# Patient Record
Sex: Male | Born: 1955
Health system: Southern US, Community
[De-identification: ages and names within clinical notes are randomized; demographics above are authoritative.]

## PROBLEM LIST (undated history)

## (undated) DIAGNOSIS — S2249XA Multiple fractures of ribs, unspecified side, initial encounter for closed fracture: Secondary | ICD-10-CM

## (undated) DIAGNOSIS — C801 Malignant (primary) neoplasm, unspecified: Secondary | ICD-10-CM

## (undated) DIAGNOSIS — G4730 Sleep apnea, unspecified: Secondary | ICD-10-CM

## (undated) DIAGNOSIS — E785 Hyperlipidemia, unspecified: Secondary | ICD-10-CM

## (undated) DIAGNOSIS — S2239XA Fracture of one rib, unspecified side, initial encounter for closed fracture: Secondary | ICD-10-CM

## (undated) HISTORY — DX: Sleep apnea, unspecified: G47.30

## (undated) HISTORY — DX: Hyperlipidemia, unspecified: E78.5

## (undated) HISTORY — DX: Malignant (primary) neoplasm, unspecified: C80.1

## (undated) HISTORY — PX: TONSILLECTOMY: SUR1361

## (undated) HISTORY — PX: VASECTOMY: SHX75

## (undated) HISTORY — PX: OTHER SURGICAL HISTORY: SHX169

## (undated) HISTORY — PX: POLYPECTOMY: SHX149

## (undated) HISTORY — PX: COLONOSCOPY: SHX174

---

## 2006-12-17 ENCOUNTER — Ambulatory Visit: Payer: Self-pay | Admitting: Internal Medicine

## 2006-12-17 LAB — CONVERTED CEMR LAB
ALT: 24 units/L (ref 0–40)
Basophils Relative: 0.7 % (ref 0.0–1.0)
Bilirubin Urine: NEGATIVE
Bilirubin, Direct: 0.2 mg/dL (ref 0.0–0.3)
CO2: 32 meq/L (ref 19–32)
Calcium: 9.2 mg/dL (ref 8.4–10.5)
Cholesterol: 260 mg/dL (ref 0–200)
Direct LDL: 193.6 mg/dL
Eosinophils Absolute: 0.3 10*3/uL (ref 0.0–0.6)
Eosinophils Relative: 5.7 % — ABNORMAL HIGH (ref 0.0–5.0)
GFR calc Af Amer: 102 mL/min
GFR calc non Af Amer: 84 mL/min
Glucose, Bld: 109 mg/dL — ABNORMAL HIGH (ref 70–99)
HDL: 45.8 mg/dL (ref >39.0)
Hemoglobin: 14.7 g/dL (ref 13.0–17.0)
Leukocytes, UA: NEGATIVE
Lymphocytes Relative: 18.6 % (ref 12.0–46.0)
MCV: 86 fL (ref 78.0–100.0)
Monocytes Absolute: 0.5 10*3/uL (ref 0.2–0.7)
Monocytes Relative: 8.8 % (ref 3.0–11.0)
Neutro Abs: 3.8 10*3/uL (ref 1.4–7.7)
PSA: 0.69 ng/mL (ref 0.10–4.00)
Platelets: 245 10*3/uL (ref 150–400)
Potassium: 4.7 meq/L (ref 3.5–5.1)
TSH: 0.82 microintl units/mL (ref 0.35–5.50)
Total Protein: 7 g/dL (ref 6.0–8.3)
Triglycerides: 147 mg/dL (ref 0–149)
Urine Glucose: NEGATIVE mg/dL
WBC: 5.6 10*3/uL (ref 4.5–10.5)

## 2007-08-19 ENCOUNTER — Ambulatory Visit: Payer: Self-pay | Admitting: Gastroenterology

## 2007-08-30 ENCOUNTER — Encounter: Payer: Self-pay | Admitting: Internal Medicine

## 2007-08-30 ENCOUNTER — Ambulatory Visit: Payer: Self-pay | Admitting: Gastroenterology

## 2008-02-20 ENCOUNTER — Encounter: Payer: Self-pay | Admitting: Internal Medicine

## 2008-02-20 DIAGNOSIS — J45909 Unspecified asthma, uncomplicated: Secondary | ICD-10-CM | POA: Insufficient documentation

## 2008-02-20 DIAGNOSIS — J309 Allergic rhinitis, unspecified: Secondary | ICD-10-CM | POA: Insufficient documentation

## 2008-02-20 DIAGNOSIS — E785 Hyperlipidemia, unspecified: Secondary | ICD-10-CM | POA: Insufficient documentation

## 2008-05-19 ENCOUNTER — Ambulatory Visit: Payer: Self-pay | Admitting: Internal Medicine

## 2008-05-19 LAB — CONVERTED CEMR LAB
AST: 17 units/L (ref 0–37)
Albumin: 4.2 g/dL (ref 3.5–5.2)
Alkaline Phosphatase: 41 units/L (ref 39–117)
BUN: 10 mg/dL (ref 6–23)
Basophils Relative: 0.2 % (ref 0.0–3.0)
Cholesterol: 191 mg/dL (ref 0–200)
Creatinine, Ser: 0.9 mg/dL (ref 0.4–1.5)
Eosinophils Absolute: 0.3 10*3/uL (ref 0.0–0.7)
Eosinophils Relative: 5.3 % — ABNORMAL HIGH (ref 0.0–5.0)
GFR calc Af Amer: 115 mL/min
Glucose, Bld: 102 mg/dL — ABNORMAL HIGH (ref 70–99)
HCT: 43.9 % (ref 39.0–52.0)
Hemoglobin, Urine: NEGATIVE
Hemoglobin: 15.3 g/dL (ref 13.0–17.0)
MCV: 89.4 fL (ref 78.0–100.0)
Monocytes Absolute: 0.5 10*3/uL (ref 0.1–1.0)
Monocytes Relative: 7.8 % (ref 3.0–12.0)
Nitrite: NEGATIVE
PSA: 0.5 ng/mL (ref 0.10–4.00)
Platelets: 224 10*3/uL (ref 150–400)
Potassium: 4.8 meq/L (ref 3.5–5.1)
RBC: 4.91 M/uL (ref 4.22–5.81)
Specific Gravity, Urine: 1.01 (ref 1.000–1.03)
Total Protein, Urine: NEGATIVE mg/dL
Total Protein: 6.8 g/dL (ref 6.0–8.3)
Urine Glucose: NEGATIVE mg/dL
WBC: 6 10*3/uL (ref 4.5–10.5)
pH: 8 (ref 5.0–8.0)

## 2008-05-28 ENCOUNTER — Ambulatory Visit: Payer: Self-pay | Admitting: Internal Medicine

## 2008-05-28 DIAGNOSIS — G479 Sleep disorder, unspecified: Secondary | ICD-10-CM | POA: Insufficient documentation

## 2008-05-28 DIAGNOSIS — M545 Low back pain, unspecified: Secondary | ICD-10-CM | POA: Insufficient documentation

## 2008-06-10 ENCOUNTER — Ambulatory Visit: Payer: Self-pay | Admitting: Internal Medicine

## 2008-06-10 DIAGNOSIS — D485 Neoplasm of uncertain behavior of skin: Secondary | ICD-10-CM | POA: Insufficient documentation

## 2008-06-26 ENCOUNTER — Telehealth: Payer: Self-pay | Admitting: Internal Medicine

## 2010-06-30 ENCOUNTER — Telehealth (INDEPENDENT_AMBULATORY_CARE_PROVIDER_SITE_OTHER): Payer: Self-pay | Admitting: *Deleted

## 2010-11-08 NOTE — Progress Notes (Signed)
  Phone Note Other Incoming   Request: Send information Summary of Call: Request for records received from [place] Sherriff's office. Request forwarded to [facility].     

## 2010-11-25 ENCOUNTER — Encounter: Payer: Self-pay | Admitting: Internal Medicine

## 2011-01-03 ENCOUNTER — Other Ambulatory Visit: Payer: Self-pay | Admitting: Internal Medicine

## 2011-01-06 ENCOUNTER — Telehealth: Payer: Self-pay | Admitting: *Deleted

## 2011-01-06 MED ORDER — ATORVASTATIN CALCIUM 10 MG PO TABS: 10.0000 mg | ORAL_TABLET | Freq: Every day | ORAL | Status: DC

## 2011-01-06 NOTE — Telephone Encounter (Signed)
refill 

## 2011-01-17 ENCOUNTER — Other Ambulatory Visit: Payer: Self-pay

## 2011-01-17 ENCOUNTER — Other Ambulatory Visit: Payer: Self-pay | Admitting: Internal Medicine

## 2011-01-17 DIAGNOSIS — Z0389 Encounter for observation for other suspected diseases and conditions ruled out: Secondary | ICD-10-CM

## 2011-01-17 DIAGNOSIS — Z0000 Encounter for general adult medical examination without abnormal findings: Secondary | ICD-10-CM

## 2011-01-19 ENCOUNTER — Other Ambulatory Visit (INDEPENDENT_AMBULATORY_CARE_PROVIDER_SITE_OTHER): Payer: BLUE CROSS/BLUE SHIELD

## 2011-01-19 ENCOUNTER — Other Ambulatory Visit (INDEPENDENT_AMBULATORY_CARE_PROVIDER_SITE_OTHER): Payer: BLUE CROSS/BLUE SHIELD | Admitting: Internal Medicine

## 2011-01-19 DIAGNOSIS — Z0389 Encounter for observation for other suspected diseases and conditions ruled out: Secondary | ICD-10-CM

## 2011-01-19 DIAGNOSIS — E785 Hyperlipidemia, unspecified: Secondary | ICD-10-CM

## 2011-01-19 DIAGNOSIS — Z0000 Encounter for general adult medical examination without abnormal findings: Secondary | ICD-10-CM

## 2011-01-19 LAB — BASIC METABOLIC PANEL
Calcium: 9.2 mg/dL (ref 8.4–10.5)
GFR: 99.97 mL/min (ref >60.00)
Glucose, Bld: 91 mg/dL (ref 70–99)
Sodium: 138 mEq/L (ref 135–145)

## 2011-01-19 LAB — LIPID PANEL
Total CHOL/HDL Ratio: 5
VLDL: 46 mg/dL — ABNORMAL HIGH (ref 0.0–40.0)

## 2011-01-19 LAB — URINALYSIS
Bilirubin Urine: NEGATIVE
Hgb urine dipstick: NEGATIVE
Leukocytes, UA: NEGATIVE
Nitrite: NEGATIVE

## 2011-01-19 LAB — HEPATIC FUNCTION PANEL
AST: 20 U/L (ref 0–37)
Alkaline Phosphatase: 43 U/L (ref 39–117)
Total Bilirubin: 0.9 mg/dL (ref 0.3–1.2)

## 2011-01-19 LAB — CBC WITH DIFFERENTIAL/PLATELET
Basophils Absolute: 0 10*3/uL (ref 0.0–0.1)
Hemoglobin: 15.5 g/dL (ref 13.0–17.0)
Lymphocytes Relative: 21.7 % (ref 12.0–46.0)
Monocytes Relative: 10.3 % (ref 3.0–12.0)
Platelets: 220 10*3/uL (ref 150.0–400.0)
RDW: 13.3 % (ref 11.5–14.6)
WBC: 6 10*3/uL (ref 4.5–10.5)

## 2011-01-19 LAB — TSH: TSH: 1 u[IU]/mL (ref 0.35–5.50)

## 2011-01-19 LAB — PSA: PSA: 0.78 ng/mL (ref 0.10–4.00)

## 2011-01-20 ENCOUNTER — Telehealth: Payer: Self-pay | Admitting: *Deleted

## 2011-01-24 ENCOUNTER — Ambulatory Visit (INDEPENDENT_AMBULATORY_CARE_PROVIDER_SITE_OTHER): Payer: BLUE CROSS/BLUE SHIELD | Admitting: Internal Medicine

## 2011-01-24 DIAGNOSIS — J309 Allergic rhinitis, unspecified: Secondary | ICD-10-CM

## 2011-01-24 DIAGNOSIS — G479 Sleep disorder, unspecified: Secondary | ICD-10-CM

## 2011-01-24 DIAGNOSIS — Z0000 Encounter for general adult medical examination without abnormal findings: Secondary | ICD-10-CM

## 2011-01-24 DIAGNOSIS — E785 Hyperlipidemia, unspecified: Secondary | ICD-10-CM

## 2011-01-24 MED ORDER — ATORVASTATIN CALCIUM 20 MG PO TABS: 20.0000 mg | ORAL_TABLET | Freq: Every day | ORAL | Status: DC

## 2011-01-24 MED ORDER — LORAZEPAM 0.5 MG PO TABS: 0.5000 mg | ORAL_TABLET | Freq: Two times a day (BID) | ORAL | Status: AC

## 2011-01-24 MED ORDER — ATORVASTATIN CALCIUM 10 MG PO TABS: 20.0000 mg | ORAL_TABLET | Freq: Every day | ORAL | Status: DC
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
## 2011-01-25 ENCOUNTER — Encounter: Payer: Self-pay | Admitting: Internal Medicine

## 2011-01-25 MED ORDER — SILDENAFIL CITRATE 100 MG PO TABS: 100.0000 mg | ORAL_TABLET | Freq: Every day | ORAL | Status: DC | PRN

## 2011-01-27 NOTE — Telephone Encounter (Signed)
rx

## 2011-02-24 NOTE — Assessment & Plan Note (Signed)
[facility]                           PRIMARY CARE OFFICE NOTE   Macky, Galik KEILAND PICKERING                       MRN:          [id number]  DATE:12/17/2006                            DOB:          02-12-1956    Zachary [name] is a 55 year old Caucasian gentleman, last seen in the  office June 08, 2004.  He has been followed for hyperlipidemia and  been given a trial of Lipitor in the past, which he is basically not  taking on a regular basis.  Last laboratory in our office dates from  March 28, 2002.  Last lipid panel actually is on August 26, 2001 with a  cholesterol of 226, with an LDL of 124 and this was on Lipitor.  The  patient did have labs done in August 2004 at work as part of the health  screening, which revealed him to have a cholesterol of 280 with an LDL  of 182 and HDL of 60.   The patient reports he is actually feeling well and doing well.  His  primary problem is occasional low back problems.  This has been going on  for several years.  He has had chiropractic consultation as well as  considered PT and OT in the past.  The patient had also been seen  previously by Dr. 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   PAST MEDICAL HISTORY:   SURGICAL:  1. Tonsillectomy remote.  2. Vasectomy in 1994.   MEDICAL ILLNESSES:  1. Usual childhood disease.  2. Athletic injuries and sprains.  3. Mild asthma symptoms in the past.  4. Minimal malalignment of pelvis and a question of mild scoliosis.  5. History of allergy.  6. Hyperlipidemia.   FAMILY HISTORY:  Father was killed in a motor vehicle accident at age  35.  Family history is negative for colon cancer, CAD, diabetes or other  significant inheritable disease.   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  The patient  reports that he has a son who is graduating from  [place] who will be joining his business and be based in [place]  initially. He has a daughter who is at the [employer] in [place].  He has 2 other children, one who is starting to drive, one who is  in middle school. He reports that his marriage is in good health.   CURRENT MEDICATIONS:  No regular medications.  He has used Ambien on a  p.r.n. basis in past.  Singulair 10 mg daily during his allergy season.  Allegra 60 mg b.i.d. as needed.   REVIEW OF SYSTEMS:  Is negative for any constitutional, ophthalmology,  cardiovascular, respiratory, GI or GU problems.  Musculoskeletal as  above.  No neurologic or psychiatric issues.   EXAMINATION:  Temperature was 97.3, blood pressure 118/72, pulse 65.  Weight is 226.  Height 6 foot 10.  GENERAL APPEARANCE:  This is a tall, slender gentleman, looks athletic,  in no acute distress.  HEENT:  Normocephalic.  Atraumatic.  EACs and TMs were normal.  Oropharynx with native dentition in good repair.  No buccal lesions were  noted.  Palate is well-healed with no evidence of previous surgery.  Posterior pharynx was clear.  Conjunctivae and sclerae were clear.  Pupils equal, round and reactive to light and accommodation.  Funduscopic exam with normal disc margins with no vascular  abnormalities.  NECK:  Supple without thyromegaly.  NODES:  No adenopathy was noted in the cervical or supraclavicular  regions.  CHEST:  No CVA tenderness.  LUNGS:  Were clear to auscultation and percussion.  CARDIOVASCULAR:  2+ radial pulse, no JVD or carotid bruits.  He had a  quiet precordium with regular rate and rhythm without murmurs, rubs or  gallops.  ABDOMEN:  Soft, no guarding or rebound.  No organosplenomegaly was  noted.  GENITALIA:  Was normal.  RECTAL:  Exam revealed normal sphincter tone.  Prostate was smooth,  round, normal in size and contour.  EXTREMITIES:  Without clubbing,  cyanosis, edema or deformities noted.  SKIN:  Was clear.  NEUROLOGIC:  Exam was nonfocal.   DATA BASE:  A 12 lead electrocardiogram reveal a normal bradycardia with  no other abnormalities noted.  Laboratory revealed a hemoglobin of 14.7 grams, white count was 5600  with a normal differential.  Chemistries revealed normal electrolytes,  serum glucose was 109.  Renal function normal with a creatinine of 1 and  a GFR of 84 ml per minute.  Liver functions were normal.  Cholesterol  came back at 260, triglycerides were 147, HDL was 45.8, LDL was markedly  elevated at 193.6.  Thyroid function was normal with a TSH of 0.82, PSA  was normal at 0.69.  Urinalysis was negative.   ASSESSMENT AND PLAN:  1. Lipids.  The patient with significant hyperlipidemia.  Would      recommend he resume the use of Lipitor 20 mg daily. I will be happy      to provide a prescription if needed.  Will have follow up      laboratory in 4 to 6 weeks to assure we are at goal and to follow      up on liver functions.  2. Musculoskeletal.  The patient with occasional back pain and      discomfort.  He has a history of a minimally malaligned pelvis and      a question of mild scoliosis.  He has a very long torso.   PLAN:  1. Provide the patient with a set of back exercises.  Also suggested      he consider consultation with integrative therapies for both      treatment and education in regards to back health and exercise.  2. Health maintenance.  The patient with a normal prostate exam and      normal PSA.  He will be turning 51 soon and would be a candidate      for colorectal cancer screening with colonoscopy and we will be      glad to schedule this at his request.  Of note, the patient has had      abdominal ultrasound April 01, 2002 with no evidence of abdominal      aortic aneurysm.   In summary, a very pleasant gentleman who is healthy, with hyperlipidemia.  Plan as outlined above.  The patient will return to  see  me on an p.r.n. basis.     Rosalyn Gess Norins, MD  Electronically Signed    MEN/MedQ  DD: 12/18/2006  DT: 12/18/2006  Job #: [id number]   cc:   Gaspar Cola

## 2011-03-07 ENCOUNTER — Other Ambulatory Visit: Payer: Self-pay | Admitting: Internal Medicine

## 2011-03-08 NOTE — Telephone Encounter (Signed)
k

## 2011-03-08 NOTE — Telephone Encounter (Signed)
Pharmacy: [id number] - [place], [place] - [phone] Ph: [phone]   MRN: [id number]  PCP: Duke Salvia, MD  Wt: 230 lb (104.327 kg) (01/24/2011)   Home: [phone]  Work: [phone]  Mobile: [phone]       Requested Medications     LORazepam (ATIVAN) 0.5 MG tablet [Pharmacy Med Name: LORAZEPAM 0.5 MG TABLET]    take 1 tablet by mouth twice a day    Disp: 60 tablet R: 0 Start: 03/07/2011 Class: Normal    Originally ordered on: 01/24/2011 Last refill: 01/24/2011 Order History     Please advise refills

## 2011-03-09 ENCOUNTER — Other Ambulatory Visit: Payer: Self-pay | Admitting: Internal Medicine

## 2011-03-13 NOTE — Telephone Encounter (Signed)
Ok for refills x 5 

## 2011-03-14 MED ORDER — LORAZEPAM 0.5 MG PO TABS: 0.5000 mg | ORAL_TABLET | Freq: Two times a day (BID) | ORAL | Status: AC

## 2011-03-30 ENCOUNTER — Ambulatory Visit (INDEPENDENT_AMBULATORY_CARE_PROVIDER_SITE_OTHER): Payer: BLUE CROSS/BLUE SHIELD | Admitting: Internal Medicine

## 2011-03-30 ENCOUNTER — Encounter: Payer: Self-pay | Admitting: Internal Medicine

## 2011-03-30 VITALS — BP 100/62 | HR 58 | Temp 97.0°F | Resp 14 | Wt 225.8 lb

## 2011-03-30 DIAGNOSIS — W57XXXA Bitten or stung by nonvenomous insect and other nonvenomous arthropods, initial encounter: Secondary | ICD-10-CM

## 2011-03-30 DIAGNOSIS — S30860A Insect bite (nonvenomous) of lower back and pelvis, initial encounter: Secondary | ICD-10-CM

## 2011-03-30 MED ORDER — DOXYCYCLINE HYCLATE 100 MG PO TABS: 100.0000 mg | ORAL_TABLET | Freq: Two times a day (BID) | ORAL | Status: AC

## 2011-03-30 NOTE — Progress Notes (Signed)
  Subjective:    Patient ID: Zachary [name], male    DOB: December 06, 1955, 55 y.o.   MRN: [id number]  HPI Mr. Reish was bitten by a deer tick that when removed had fed. He developed a target lesion at the site, just below the belt line left abdomen. He has had no fever, chills, pain or other symptoms.  PMH, FamHx and SocHx reviewed for any changes and relevance.    Review of Systems Review of Systems  Constitutional:  Negative for fever, chills, activity change and unexpected weight change.  HEENT:  Negative for hearing loss, ear pain, congestion, neck stiffness and postnasal drip. Negative for sore throat or swallowing problems. Negative for dental complaints.   Eyes: Negative for vision loss or change in visual acuity.  Respiratory: Negative for chest tightness and wheezing.   Cardiovascular: Negative for chest pain and palpitationNo decreased exercise tolerance Gastrointestinal: No change in bowel habit. No bloating or gas. No reflux or indigestion Genitourinary: Negative for urgency, frequency, flank pain and difficulty urinating.  Musculoskeletal: Negative for myalgias, back pain, arthralgias and gait problem.  Neurological: Negative for dizziness, tremors, weakness and headaches.  Hematological: Negative for adenopathy.  Psychiatric/Behavioral: Negative for behavioral problems and dysphoric mood.       Objective:   Physical Exam vitals noted   Derm - erythematous macular lesion left lower abdomen about 5 cm in diameter without a clear center.   Assessment & Plan:  Tick Bite - suspicious appearance. Lyme's unlikely in [place] but due to target lesion will cover with doxy  Plan - doxycycline 100mg  bid x 7 days.

## 2011-09-25 ENCOUNTER — Other Ambulatory Visit: Payer: Self-pay | Admitting: Internal Medicine

## 2012-03-14 ENCOUNTER — Other Ambulatory Visit: Payer: Self-pay | Admitting: *Deleted

## 2012-03-14 MED ORDER — ATORVASTATIN CALCIUM 20 MG PO TABS: 20.0000 mg | ORAL_TABLET | Freq: Every day | ORAL | Status: DC

## 2012-03-14 NOTE — Telephone Encounter (Signed)
Rx sent to [facility] atorvastatin with instructions need to make appt. With Dr. Debby Bud

## 2012-03-15 ENCOUNTER — Telehealth: Payer: Self-pay | Admitting: *Deleted

## 2012-03-15 NOTE — Telephone Encounter (Signed)
Patient request refill on lorazepam 0.5mg  tablet. LOV 03/30/2011

## 2012-03-15 NOTE — Telephone Encounter (Signed)
OK for refill x 5. 

## 2012-03-15 NOTE — Telephone Encounter (Signed)
RX CALLED TO [facility] ON DOCTOR VOICE LINE. FOR LORAZEPAM. PATIENT NOTIFIED

## 2012-07-17 ENCOUNTER — Other Ambulatory Visit: Payer: Self-pay | Admitting: Dermatology

## 2012-10-01 ENCOUNTER — Other Ambulatory Visit: Payer: Self-pay | Admitting: *Deleted

## 2012-10-02 MED ORDER — LORAZEPAM 0.5 MG PO TABS: 0.5000 mg | ORAL_TABLET | Freq: Two times a day (BID) | ORAL | Status: DC

## 2012-10-07 ENCOUNTER — Other Ambulatory Visit: Payer: Self-pay | Admitting: *Deleted

## 2012-10-07 MED ORDER — LORAZEPAM 0.5 MG PO TABS: 0.5000 mg | ORAL_TABLET | Freq: Two times a day (BID) | ORAL | Status: DC

## 2012-10-21 ENCOUNTER — Other Ambulatory Visit: Payer: Self-pay | Admitting: Internal Medicine

## 2013-01-15 ENCOUNTER — Other Ambulatory Visit: Payer: Self-pay | Admitting: Internal Medicine

## 2013-01-16 ENCOUNTER — Other Ambulatory Visit: Payer: Self-pay

## 2013-01-17 ENCOUNTER — Other Ambulatory Visit: Payer: Self-pay

## 2013-01-17 MED ORDER — LORAZEPAM 0.5 MG PO TABS: 0.5000 mg | ORAL_TABLET | Freq: Two times a day (BID) | ORAL | Status: DC

## 2013-01-18 NOTE — Telephone Encounter (Signed)
What is the medication being requested ?

## 2013-05-19 ENCOUNTER — Other Ambulatory Visit: Payer: Self-pay

## 2013-05-19 MED ORDER — LORAZEPAM 0.5 MG PO TABS: 0.5000 mg | ORAL_TABLET | Freq: Two times a day (BID) | ORAL | Status: DC

## 2013-05-19 NOTE — Telephone Encounter (Signed)
Lorazepam called to pharmacy.   I meant to say patient hasn't been seen since 03/30/2011

## 2013-05-19 NOTE — Telephone Encounter (Signed)
Patient has not been seen since 03/29/2013.

## 2013-06-18 ENCOUNTER — Other Ambulatory Visit: Payer: Self-pay | Admitting: Internal Medicine

## 2013-07-24 ENCOUNTER — Ambulatory Visit (INDEPENDENT_AMBULATORY_CARE_PROVIDER_SITE_OTHER): Payer: BLUE CROSS/BLUE SHIELD | Admitting: Internal Medicine

## 2013-07-24 ENCOUNTER — Encounter: Payer: Self-pay | Admitting: Internal Medicine

## 2013-07-24 VITALS — BP 136/88 | HR 63 | Temp 97.3°F | Ht >= 80 in | Wt 231.0 lb

## 2013-07-24 DIAGNOSIS — E785 Hyperlipidemia, unspecified: Secondary | ICD-10-CM

## 2013-07-24 DIAGNOSIS — G479 Sleep disorder, unspecified: Secondary | ICD-10-CM

## 2013-07-24 DIAGNOSIS — F411 Generalized anxiety disorder: Secondary | ICD-10-CM

## 2013-07-24 DIAGNOSIS — G4730 Sleep apnea, unspecified: Secondary | ICD-10-CM

## 2013-07-24 DIAGNOSIS — Z0000 Encounter for general adult medical examination without abnormal findings: Secondary | ICD-10-CM

## 2013-07-24 DIAGNOSIS — J45909 Unspecified asthma, uncomplicated: Secondary | ICD-10-CM

## 2013-07-24 MED ORDER — PAXIL CR 12.5 MG PO TB24: 12.5000 mg | ORAL_TABLET | ORAL | Status: DC

## 2013-07-24 MED ORDER — ATORVASTATIN CALCIUM 20 MG PO TABS: 20.0000 mg | ORAL_TABLET | Freq: Every day | ORAL | Status: DC

## 2013-07-24 MED ORDER — SILDENAFIL CITRATE 100 MG PO TABS: 100.0000 mg | ORAL_TABLET | Freq: Every day | ORAL | Status: DC | PRN

## 2013-07-24 NOTE — Progress Notes (Signed)
Subjective:    Patient ID: Zachary [name], male    DOB: [birth year], 56 y.o.   MRN: [id number]  HPI Zachary [name] presents for routine medical follow up.  Cc: he has sleep duration insomnia that has developed over the interval since his last. He admits to increased stress with early awakening due to many issues on his mind, some irritability and some difficulty in focus. For quite some time he has been taking lorazepam as asleep medication.  His wife has commented on his loud snoring and reports to him that he does appear to have breath holding episodes. He denies hypersomnolence but has the insomnia and admits to not feeling rested.  He has been out of Lipitor for 3+ weeks - new Rx has been provided. Lab work will be delayed until he has been back on medication for several weeks.   He does continue to ride motorcycles including long multi-state rides. He has noticed that he has some hearing loss after a long ride.   In general his diet is healthy although he is often out of country and eats out. He has not been getting as much exercise as usual due to the press of work. He is current with his dentist and eye doctor.   Past Medical History  Diagnosis Date  . Allergic rhinitis   . Asthma   . Hyperlipemia    Past Surgical History  Procedure Laterality Date  . Tonsillectomy    . Vasectomy     History reviewed. No pertinent family history. History   Social History  . Marital Status: Married    Spouse Name: Zachary [name]    Number of Children: 4  . Years of Education: 16   Occupational History  . 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 Smoking status: Never Smoker   . Smokeless tobacco: Not on file  . Alcohol Use: 1.5 oz/week    3 drink(s) per week  . Drug Use: No  . Sexual Activity: Yes    Partners: Female   Other Topics Concern  . 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  2 sons- eldest in business with  him;  2 daughters- eldest in fashion, younger interested in veteranarian medicine.  Marriage- in good health.  Riding "murdur" cycle but less than before.    Current Outpatient Prescriptions on File Prior to Visit  Medication Sig Dispense Refill  . Garlic 500 MG TABS Take by mouth.        Marland Kitchen LORazepam (ATIVAN) 0.5 MG tablet Take 1 tablet (0.5 mg total) by mouth 2 (two) times daily.  60 tablet  2  . MULTIPLE VITAMIN PO Take by mouth daily.        . Omega-3 Fatty Acids (FISH OIL) 1000 MG CAPS Take by mouth daily.        . Red Yeast Rice 600 MG CAPS Take by mouth.        . Vitamin E 400 UNITS TABS Take by mouth daily. 2 tablets daily        No current facility-administered medications on file prior to visit.      Review of Systems Constitutional:  Negative for fever, chills, activity change and unexpected weight change.  HEENT:  Negative for hearing loss, ear pain, congestion, neck stiffness and postnasal drip. Negative for sore throat or swallowing problems. Negative for dental complaints.   Eyes: Negative for vision loss or change in  visual acuity.  Respiratory: Negative for chest tightness and wheezing. Negative for DOE.   Cardiovascular: Negative for chest pain or palpitations. No decreased exercise tolerance Gastrointestinal: No change in bowel habit. No bloating or gas. No reflux or indigestion Genitourinary: Negative for urgency, frequency, flank pain and difficulty urinating.  Musculoskeletal: Negative for myalgias, back pain, arthralgias and gait problem.  Neurological: Negative for dizziness, tremors, weakness and headaches.  Hematological: Negative for adenopathy.  Psychiatric/Behavioral: Negative for behavioral problems and dysphoric mood. Positive for signs of stress and worry.       Objective:   Physical Exam Filed Vitals:   07/24/13 1016  BP: 136/88  Pulse: 63  Temp: 97.3 F (36.3 C)   Wt Readings from Last 3 Encounters:  07/24/13 231 lb (104.781 kg)  03/30/11  225 lb 12 oz (102.4 kg)  01/24/11 230 lb (104.327 kg)   Body mass index is 24.15 kg/(m^2).  Gen'l: Well nourished well developed tall man in no acute distress  HEENT: Head: Normocephalic and atraumatic. Right Ear: External ear normal. EAC/TM nl. Left Ear: External ear normal.  EAC/TM nl. Nose: Nose normal. Mouth/Throat: Oropharynx is clear and moist. Dentition - native, in good repair. No buccal or palatal lesions. Posterior pharynx clear. Eyes: Conjunctivae and sclera clear. EOM intact. Pupils are equal, round, and reactive to light. Right eye exhibits no discharge. Left eye exhibits no discharge. Neck: Normal range of motion. Neck supple. No JVD present. No tracheal deviation present. No thyromegaly present.  Cardiovascular: Normal rate, regular rhythm, no gallop, no friction rub, no murmur heard.      Quiet precordium. 2+ radial and DP pulses . No carotid bruits Pulmonary/Chest: Effort normal. No respiratory distress or increased WOB, no wheezes, no rales. No chest wall deformity or CVAT. Abdomen: Soft. Bowel sounds are normal in all quadrants. He exhibits no distension, no tenderness, no rebound or guarding, No heptosplenomegaly  Genitourinary:  deferred Musculoskeletal: Normal range of motion. He exhibits no edema and no tenderness.       Small and large joints without redness, synovial thickening or deformity. Full range of motion preserved about all small, median and large joints.  Lymphadenopathy:    He has no cervical or supraclavicular adenopathy.  Neurological: He is alert and oriented to person, place, and time. CN II-XII intact. DTRs 2+ and symmetrical biceps, radial and patellar tendons. Cerebellar function normal with no tremor, rigidity, normal gait and station.  Skin: Skin is warm and dry. No rash noted. No erythema.  Psychiatric: He has a normal mood and affect. His behavior is normal. Thought content normal.         Assessment & Plan:  b

## 2013-07-24 NOTE — Patient Instructions (Signed)
Good to see you. Remember gray is distinguished  1.Sleep disorder:  Sleep is a learned or unlearned behavior. 5 principles of sleep hygiene - 1) regular hour to retire and rise 7days/wk 2) no stimulants - caffeine, chocolat, alcohol, 3) regular exercise  - every afternoon  4) sleep sanctuary - a space that is right light, temperature, sound level, good bed where all you do is sleep. 5) No extinction behaviors, e.g. Laying in bed awake doing anything but sleeping. This means if you have a bad night - no naps, etc. Structured reflection prior to retiring - review the day to be sure all events have been processed; review the next day's events to be sure you're ready. In addition - try melatonin. May also try Rx for Rozerem if needed. Ambien is better and safer than lorazepam  2. Generalized anxiety - lots of stress. Plan trial of Paxil CR 12.5 mg brandname only  3. Lipids - restart lipitor. Plan  Lab work 3+ weeks after restarting medication.  4. Sleep apnea - snoring and witnessed breath holding. Plan  Sleep study with recommendations to follow  If too much push back will refer to sleep doctor.   5. Health maintenance - Current with colorectal cancer screening. Discussed pros and cons of prostate cancer screening (USPHCTF recommendations reviewed and ACU April '13 recommendations) and reviewed PSA values last 3 tests - all 100% normal. No additional testing at this time. Current with immunizations.  Flu shot today.,

## 2013-07-27 DIAGNOSIS — F411 Generalized anxiety disorder: Secondary | ICD-10-CM | POA: Insufficient documentation

## 2013-07-27 DIAGNOSIS — Z0000 Encounter for general adult medical examination without abnormal findings: Secondary | ICD-10-CM | POA: Insufficient documentation

## 2013-07-27 NOTE — Assessment & Plan Note (Addendum)
Mr. Draheim with sleep duration insomnia, snoring and apnea. He has been using lorazepam as a sleep aide, which has become less effective over time. Discussed the disadvantages of benzodiazipines for sleep. Reviewed sleep hygiene - especially the use of structured reflection time prior to retiring.  Plan  Sleep hygiene as discussed  Sleep study to r/o OSA with referral to sleep medicine if positive or if study turned down by insurance.  Wean off lorazepam  Treat anxiety.

## 2013-07-27 NOTE — Assessment & Plan Note (Signed)
Interval h/o w/o major illness, injury or surgery. Other problems as detailed above. Physical exam is normal. Labs are pending. He is current with colorectal cancer screening. Discussed pros and cons of prostate cancer screening (USPHCTF recommendations reviewed and ACU April '13 recommendations) and he defers evaluation at this time. He has had recent PSA testing that has been normal.  In summary A very nice man generally healthy who will be evaluated for OSA, treat for insomnia with a behavorial approach treated for anxiety. He will follow up via MyChart in regard to his progress

## 2013-07-27 NOTE — Assessment & Plan Note (Signed)
He has not been taking medication although there is no problem with tolerance.  Plan Restart lipitor  Lab in 3+ weeks with recommendations to follow.

## 2013-07-27 NOTE — Assessment & Plan Note (Signed)
GAD - driven by work situation.  Plan Paxil CR 12.5 mg once a day (brand name only)  Feed back as to effectiveness vis MyChart msg.

## 2013-07-27 NOTE — Assessment & Plan Note (Signed)
No flares or episodes of respiratory distress

## 2014-03-20 ENCOUNTER — Encounter: Payer: Self-pay | Admitting: Internal Medicine

## 2014-03-20 ENCOUNTER — Other Ambulatory Visit: Payer: BC Managed Care – PPO

## 2014-03-20 ENCOUNTER — Ambulatory Visit (INDEPENDENT_AMBULATORY_CARE_PROVIDER_SITE_OTHER): Payer: BC Managed Care – PPO | Admitting: Internal Medicine

## 2014-03-20 VITALS — BP 130/74 | HR 78 | Temp 98.1°F | Wt 226.4 lb

## 2014-03-20 DIAGNOSIS — R509 Fever, unspecified: Secondary | ICD-10-CM

## 2014-03-20 DIAGNOSIS — W57XXXA Bitten or stung by nonvenomous insect and other nonvenomous arthropods, initial encounter: Secondary | ICD-10-CM

## 2014-03-20 DIAGNOSIS — R5381 Other malaise: Secondary | ICD-10-CM

## 2014-03-20 DIAGNOSIS — R5383 Other fatigue: Secondary | ICD-10-CM

## 2014-03-20 DIAGNOSIS — T148 Other injury of unspecified body region: Secondary | ICD-10-CM

## 2014-03-20 DIAGNOSIS — R198 Other specified symptoms and signs involving the digestive system and abdomen: Secondary | ICD-10-CM

## 2014-03-20 MED ORDER — DOXYCYCLINE HYCLATE 100 MG PO TABS: 100.0000 mg | ORAL_TABLET | Freq: Two times a day (BID) | ORAL | Status: DC

## 2014-03-20 NOTE — Progress Notes (Signed)
Pre visit review using our clinic review tool, if applicable. No additional management support is needed unless otherwise documented below in the visit note. 

## 2014-03-20 NOTE — Patient Instructions (Signed)
Your next office appointment will be determined based upon review of your pending labs. Those instructions will be transmitted to you through My Chart   Followup as needed for your acute issue. Please report any significant change in your symptoms.  NSAIDS ( Aleve, Advil, Naproxen) or Tylenol every 4 hrs as needed for fever as discussed based on label recommendations

## 2014-03-20 NOTE — Progress Notes (Signed)
   Subjective:    Patient ID: Zachary [name], male    DOB: 1956-09-28, 58 y.o.   MRN: [id number]  HPI Pt was at [facility] over [place] day (03/03/15) and reports pulling at least three ticks off of his body. On 03/15/14 pt began having joint pain, weakness, fatigue, fevers, headaches and loss of appetite. On 03/16/14 the pt reports loose stools additionally. The loose stools subsided after one day, though the former symptoms have persisted. The pt states the symptoms may be improving though he has been unable to stay at work for a full day this week. He reports a Tmax of 102 F, 03/19/14 at 5pm. The pt has been taking tylenol for his fevers and headaches with mild relief.   Review of Systems  HENT: Negative for congestion, ear discharge, ear pain, nosebleeds and rhinorrhea.   Respiratory: Negative for shortness of breath.   Cardiovascular: Negative for chest pain.  Neurological: Positive for weakness and light-headedness.       Objective:   Physical Exam Gen.: Healthy and well-nourished in appearance. Alert, appropriate and cooperative throughout exam. Appears younger than stated age   Nose: External nasal exam reveals no deformity or inflammation. Nasal mucosa are pink and moist. No lesions or exudates noted.    Mouth: Oral mucosa and oropharynx reveal no lesions or exudates. Teeth in good repair.  Lungs: Normal respiratory effort; chest expands symmetrically. Lungs are clear to auscultation without rales, wheezes, or increased work of breathing.  Heart: Normal rate and rhythm. Normal S1 and S2. No gallop, click, or rub. No murmur.         Skin: Intact without suspicious lesions or rashes.  Lymph: No cervical, axillary lymphadenopathy present.                                                                        Assessment & Plan:  #1 tick bite; will draw lyme and RMSF titers and Rx doxycycline 100mg  BID x 14 days. Pt can continue to take tylenol for aches and pains. Will warn  pt of sun sensitivity while taking doxy.

## 2014-03-20 NOTE — Progress Notes (Signed)
Subjective:    Patient ID: Zachary [name], male    DOB: July 16, 1956, 58 y.o.   MRN: [id number]  HPI Pt was at [facility] over [facility] Day (03/03/15) and reports pulling off at least three ticks .  On 03/15/14 pt began having joint pain, weakness, fatigue, fevers, headaches and loss of appetite.  On 03/16/14 the pt reports loose stools additionally. The loose stools have subsided, though the former symptoms persisted. The pt was unable to stay at work for a full day this week. He reports a Tmax of 102 F, 03/19/14 at 5pm. The pt has been taking tylenol for his fevers and headaches with mild relief.    Review of Systems  HENT: Negative for congestion, ear discharge, ear pain, nosebleeds and rhinorrhea.  Respiratory: Negative for shortness of breath.  Cardiovascular: Negative for chest pain.  Neurological: Positive for weakness and light-headedness.   He denies any changes hair, skin, nails. Specifically no rash present.  He's had no blurred vision, double vision, loss of vision  He denies any nasal purulence or cough production.  He's had no dysuria, pyuria, or hematuria. No melena or rectal bleeding    Objective:   Physical Exam  He is extremely tall and thin .  His exam is negative except for a very sharp splitting of the first heart sound & accentuated second heart sound. There is no murmur per se.  He has no conjunctival hemorrhages or nailbed hemorrhage seen.  He does have hyperextensibility of the DIP joint of the thumbs Gen.: Healthy and well-nourished in appearance. Alert, appropriate and cooperative throughout exam. Appears younger than stated age  Head: Normocephalic without obvious abnormalities Eyes: No corneal or conjunctival inflammation noted. Pupils equal round reactive to light and accommodation. Extraocular motion intact.No lid lag or proptosis. Ears: External  ear exam reveals no significant lesions or deformities. Canals clear .TMs normal. Hearing is  grossly normal bilaterally. Nose: External nasal exam reveals no deformity or inflammation. Nasal mucosa are pink and moist. No lesions or exudates noted.   Mouth: Oral mucosa and oropharynx reveal no lesions or exudates. Teeth in good repair. Neck: No deformities, masses, or tenderness noted. Supple.Range of motion normal.Thyroid normal. Lungs: Normal respiratory effort; chest expands symmetrically. Lungs are clear to auscultation without rales, wheezes, or increased work of breathing. Heart: Normal rate and rhythm.   Abdomen: Bowel sounds normal; abdomen soft and nontender. No masses, organomegaly or hernias noted.                                  Musculoskeletal/extremities: No deformity or scoliosis noted of  the thoracic or lumbar spine.   No clubbing, cyanosis, edema, or significant extremity  deformity noted. Range of motion normal .Tone & strength normal. Hand joints normal Fingernail health good. Able to lie down & sit up w/o help. Negative SLR bilaterally Vascular: Carotid, radial artery, dorsalis pedis and  posterior tibial pulses are full and equal. No bruits present. Neurologic: Alert and oriented x3. Deep tendon reflexes symmetrical and normal.  Gait normal        Skin: Intact without suspicious lesions or rashes. Lymph: No cervical, axillary lymphadenopathy present. Psych: Mood and affect are normal. Normally interactive  Assessment & Plan:  #1 Fever  #2 fatigue  #3 status post tick bites, multiple  #4 stool changes, resolved  See orders and recommendations

## 2014-03-23 LAB — B. BURGDORFI ANTIBODIES: B burgdorferi Ab IgG+IgM: 0.46 {ISR}

## 2014-03-23 LAB — ROCKY MTN SPOTTED FVR AB, IGM-BLOOD: ROCKY MTN SPOTTED FEVER, IGM: 0.12 IV

## 2014-07-27 ENCOUNTER — Other Ambulatory Visit: Payer: Self-pay | Admitting: Geriatric Medicine

## 2014-07-27 DIAGNOSIS — G4730 Sleep apnea, unspecified: Secondary | ICD-10-CM

## 2014-07-27 MED ORDER — ATORVASTATIN CALCIUM 20 MG PO TABS: 20.0000 mg | ORAL_TABLET | Freq: Every day | ORAL | Status: DC

## 2014-07-27 MED ORDER — PAXIL CR 12.5 MG PO TB24: 12.5000 mg | ORAL_TABLET | ORAL | Status: DC

## 2014-10-27 ENCOUNTER — Other Ambulatory Visit: Payer: Self-pay | Admitting: Geriatric Medicine

## 2014-10-27 MED ORDER — PAXIL CR 12.5 MG PO TB24: 12.5000 mg | ORAL_TABLET | ORAL | Status: DC

## 2015-04-05 ENCOUNTER — Other Ambulatory Visit: Payer: Self-pay | Admitting: Internal Medicine

## 2015-05-21 ENCOUNTER — Encounter (INDEPENDENT_AMBULATORY_CARE_PROVIDER_SITE_OTHER): Payer: BLUE CROSS/BLUE SHIELD | Admitting: Ophthalmology

## 2015-05-21 DIAGNOSIS — H35372 Puckering of macula, left eye: Secondary | ICD-10-CM | POA: Diagnosis not present

## 2015-05-21 DIAGNOSIS — H43813 Vitreous degeneration, bilateral: Secondary | ICD-10-CM

## 2015-06-04 ENCOUNTER — Other Ambulatory Visit: Payer: Self-pay | Admitting: Internal Medicine

## 2015-07-01 ENCOUNTER — Other Ambulatory Visit: Payer: Self-pay | Admitting: Internal Medicine

## 2015-08-03 ENCOUNTER — Other Ambulatory Visit: Payer: Self-pay | Admitting: Internal Medicine

## 2015-09-17 ENCOUNTER — Other Ambulatory Visit: Payer: Self-pay | Admitting: Internal Medicine

## 2015-09-22 NOTE — Addendum Note (Signed)
Addended by: Earnstine Regal on: 09/22/2015 03:21 PM   Modules accepted: Orders

## 2015-10-06 ENCOUNTER — Other Ambulatory Visit: Payer: Self-pay | Admitting: Internal Medicine

## 2015-12-06 ENCOUNTER — Other Ambulatory Visit: Payer: Self-pay

## 2015-12-06 DIAGNOSIS — G4730 Sleep apnea, unspecified: Secondary | ICD-10-CM

## 2015-12-06 MED ORDER — ATORVASTATIN CALCIUM 20 MG PO TABS: 20.0000 mg | ORAL_TABLET | Freq: Every day | ORAL | Status: DC

## 2016-01-11 ENCOUNTER — Telehealth: Payer: Self-pay

## 2016-01-11 NOTE — Telephone Encounter (Signed)
[facility] sent rx rq to rf paxil. Faxed request back as denied. Pt needs an appt.

## 2016-01-20 ENCOUNTER — Encounter: Payer: Self-pay | Admitting: Family

## 2016-01-20 ENCOUNTER — Ambulatory Visit (INDEPENDENT_AMBULATORY_CARE_PROVIDER_SITE_OTHER): Payer: BLUE CROSS/BLUE SHIELD | Admitting: Family

## 2016-01-20 VITALS — BP 122/80 | HR 77 | Temp 98.1°F | Resp 16 | Ht 79.0 in | Wt 239.0 lb

## 2016-01-20 DIAGNOSIS — F411 Generalized anxiety disorder: Secondary | ICD-10-CM

## 2016-01-20 DIAGNOSIS — G479 Sleep disorder, unspecified: Secondary | ICD-10-CM

## 2016-01-20 MED ORDER — PAXIL CR 12.5 MG PO TB24: ORAL_TABLET | ORAL | Status: DC

## 2016-01-20 NOTE — Assessment & Plan Note (Signed)
Situational anxiety is well controlled with current regimen and no adverse side effects. Continue current dosage of Paxil CR.

## 2016-01-20 NOTE — Assessment & Plan Note (Signed)
Sleep is stable and averaging approximate 6-7 hours with current regimen with no adverse side effects. Continue current dosage of Paxil CR. Follow-up in 3 months or sooner with new PCP.

## 2016-01-20 NOTE — Progress Notes (Signed)
Pre visit review using our clinic review tool, if applicable. No additional management support is needed unless otherwise documented below in the visit note. 

## 2016-01-20 NOTE — Progress Notes (Signed)
Subjective:    Patient ID: Zachary [name], male    DOB: July 14, 1956, 60 y.o.   MRN: [id number]  Chief Complaint  Patient presents with  . Medication Refill    refill of paxil    HPI:  Zachary [name] is a 60 y.o. male who  has a past medical history of Allergic rhinitis; Asthma; and Hyperlipemia. and presents today For a follow up.  Anxiety/Sleep - currently managed with Paxil CR. Reports taking the medication as prescribed without adverse side effects. Averages about 6-7 hours of sleep per night with the medication. There are no issues during the day. Feels well rested after sleep.   No Known Allergies   Current Outpatient Prescriptions on File Prior to Visit  Medication Sig Dispense Refill  . atorvastatin (LIPITOR) 20 MG tablet Take 1 tablet (20 mg total) by mouth daily. 90 tablet 3  . MULTIPLE VITAMIN PO Take by mouth daily.      . Omega-3 Fatty Acids (FISH OIL) 1000 MG CAPS Take by mouth daily.      . sildenafil (VIAGRA) 100 MG tablet Take 1 tablet (100 mg total) by mouth daily as needed. 10 tablet 11   No current facility-administered medications on file prior to visit.     Review of Systems  Constitutional: Negative for fever, chills, activity change and unexpected weight change.  Respiratory: Negative for chest tightness and shortness of breath.   Cardiovascular: Negative for chest pain, palpitations and leg swelling.  Psychiatric/Behavioral: Negative for sleep disturbance and dysphoric mood. The patient is not nervous/anxious.       Objective:    BP 122/80 mmHg  Pulse 77  Temp(Src) 98.1 F (36.7 C) (Oral)  Resp 16  Ht 6\' 7"  (2.007 m)  Wt 239 lb (108.41 kg)  BMI 26.91 kg/m2  SpO2 95% Nursing note and vital signs reviewed.  Physical Exam  Constitutional: He is oriented to person, place, and time. He appears well-developed and well-nourished. No distress.  Cardiovascular: Normal rate, regular rhythm, normal heart sounds and intact distal pulses.     Pulmonary/Chest: Effort normal and breath sounds normal.  Neurological: He is alert and oriented to person, place, and time.  Skin: Skin is warm and dry.  Psychiatric: He has a normal mood and affect. His behavior is normal. Judgment and thought content normal.       Assessment & Plan:   Problem List Items Addressed This Visit      Other   Disturbance in sleep behavior - Primary    Sleep is stable and averaging approximate 6-7 hours with current regimen with no adverse side effects. Continue current dosage of Paxil CR. Follow-up in 3 months or sooner with new PCP.      Relevant Medications   PAXIL CR 12.5 MG 24 hr tablet   GAD (generalized anxiety disorder)    Situational anxiety is well controlled with current regimen and no adverse side effects. Continue current dosage of Paxil CR.      Relevant Medications   PAXIL CR 12.5 MG 24 hr tablet      I have discontinued Mr. Wyndham's Garlic, Vitamin E, Red Yeast Rice, Ascorbic Acid (VITAMIN C PO), Coenzyme Q10 (COQ10 PO), and doxycycline. I am also having him maintain his MULTIPLE VITAMIN PO, Fish Oil, sildenafil, atorvastatin, and PAXIL CR.   Meds ordered this encounter  Medications  . PAXIL CR 12.5 MG 24 hr tablet    Sig: TAKE 1 BY MOUTH EVERY MORNING  Dispense:  90 tablet    Refill:  0    Order Specific Question:  Supervising Provider    Answer:  Pricilla Holm A [id number]     Follow-up: Return in about 3 months (around 04/20/2016) for new PCP visit. Mauricio Po, FNP

## 2016-01-20 NOTE — Patient Instructions (Signed)
Thank you for choosing [facility].  Summary/Instructions:  Please continue to take your mediation as prescribed.   Your prescription(s) have been submitted to your pharmacy or been printed and provided for you. Please take as directed and contact our office if you believe you are having problem(s) with the medication(s) or have any questions.  If your symptoms worsen or fail to improve, please contact our office for further instruction, or in case of emergency go directly to the emergency room at the closest medical facility.

## 2016-04-20 ENCOUNTER — Other Ambulatory Visit: Payer: Self-pay

## 2016-04-20 DIAGNOSIS — G479 Sleep disorder, unspecified: Secondary | ICD-10-CM

## 2016-04-20 DIAGNOSIS — F411 Generalized anxiety disorder: Secondary | ICD-10-CM

## 2016-04-21 MED ORDER — PAXIL CR 12.5 MG PO TB24: ORAL_TABLET | ORAL | Status: DC

## 2016-04-21 MED ORDER — PAXIL CR 12.5 MG PO TB24
ORAL_TABLET | ORAL | Status: DC
Start: 2016-04-21 — End: 2016-04-21

## 2016-05-09 DIAGNOSIS — H524 Presbyopia: Secondary | ICD-10-CM | POA: Diagnosis not present

## 2016-05-09 DIAGNOSIS — H5213 Myopia, bilateral: Secondary | ICD-10-CM | POA: Diagnosis not present

## 2016-05-09 DIAGNOSIS — H52223 Regular astigmatism, bilateral: Secondary | ICD-10-CM | POA: Diagnosis not present

## 2016-07-24 ENCOUNTER — Other Ambulatory Visit: Payer: Self-pay | Admitting: Family

## 2016-07-24 DIAGNOSIS — F411 Generalized anxiety disorder: Secondary | ICD-10-CM

## 2016-07-24 DIAGNOSIS — G479 Sleep disorder, unspecified: Secondary | ICD-10-CM

## 2016-09-14 ENCOUNTER — Encounter: Payer: Self-pay | Admitting: Family

## 2016-09-14 MED ORDER — SILDENAFIL CITRATE 100 MG PO TABS: 100.0000 mg | ORAL_TABLET | Freq: Every day | ORAL | 5 refills | Status: DC | PRN

## 2016-10-13 ENCOUNTER — Other Ambulatory Visit: Payer: Self-pay | Admitting: Family

## 2016-10-13 DIAGNOSIS — F411 Generalized anxiety disorder: Secondary | ICD-10-CM

## 2016-10-13 DIAGNOSIS — G479 Sleep disorder, unspecified: Secondary | ICD-10-CM

## 2016-10-27 ENCOUNTER — Other Ambulatory Visit: Payer: Self-pay | Admitting: Family

## 2016-10-27 DIAGNOSIS — G479 Sleep disorder, unspecified: Secondary | ICD-10-CM

## 2016-10-27 DIAGNOSIS — F411 Generalized anxiety disorder: Secondary | ICD-10-CM

## 2016-11-24 ENCOUNTER — Other Ambulatory Visit: Payer: Self-pay | Admitting: Family

## 2016-11-24 DIAGNOSIS — G479 Sleep disorder, unspecified: Secondary | ICD-10-CM

## 2016-11-24 DIAGNOSIS — F411 Generalized anxiety disorder: Secondary | ICD-10-CM

## 2016-12-17 ENCOUNTER — Other Ambulatory Visit: Payer: Self-pay | Admitting: Internal Medicine

## 2016-12-17 DIAGNOSIS — G4730 Sleep apnea, unspecified: Secondary | ICD-10-CM

## 2016-12-27 ENCOUNTER — Other Ambulatory Visit: Payer: Self-pay | Admitting: Family

## 2016-12-27 DIAGNOSIS — G479 Sleep disorder, unspecified: Secondary | ICD-10-CM

## 2016-12-27 DIAGNOSIS — F411 Generalized anxiety disorder: Secondary | ICD-10-CM

## 2017-01-09 ENCOUNTER — Other Ambulatory Visit: Payer: Self-pay | Admitting: Internal Medicine

## 2017-01-09 ENCOUNTER — Other Ambulatory Visit: Payer: Self-pay | Admitting: Family

## 2017-01-09 DIAGNOSIS — F411 Generalized anxiety disorder: Secondary | ICD-10-CM

## 2017-01-09 DIAGNOSIS — G4730 Sleep apnea, unspecified: Secondary | ICD-10-CM

## 2017-01-09 DIAGNOSIS — G479 Sleep disorder, unspecified: Secondary | ICD-10-CM

## 2017-01-19 ENCOUNTER — Encounter: Payer: Self-pay | Admitting: Family

## 2017-01-19 ENCOUNTER — Ambulatory Visit (INDEPENDENT_AMBULATORY_CARE_PROVIDER_SITE_OTHER): Payer: BLUE CROSS/BLUE SHIELD | Admitting: Family

## 2017-01-19 ENCOUNTER — Other Ambulatory Visit: Payer: BLUE CROSS/BLUE SHIELD

## 2017-01-19 VITALS — BP 110/68 | HR 65 | Temp 98.0°F | Resp 16 | Ht 79.0 in | Wt 238.0 lb

## 2017-01-19 DIAGNOSIS — G4730 Sleep apnea, unspecified: Secondary | ICD-10-CM | POA: Diagnosis not present

## 2017-01-19 DIAGNOSIS — Z0000 Encounter for general adult medical examination without abnormal findings: Secondary | ICD-10-CM | POA: Diagnosis not present

## 2017-01-19 DIAGNOSIS — Z7289 Other problems related to lifestyle: Secondary | ICD-10-CM

## 2017-01-19 NOTE — Progress Notes (Signed)
Subjective:    Patient ID: Zachary [name], male    DOB: [birth year], 61 y.o.   MRN: [id number]  Chief Complaint  Patient presents with  . CPE    Not fasting    HPI:  Zachary [name] is a 61 y.o. male who presents today for an annual wellness visit.   1) Health Maintenance -   Diet - Averages about 3 meals per day consisting of a regular diet; Caffeine intake of about 2-3 cups per day.   Exercise - 3x per week primarily cardio.    2) Preventative Exams / Immunizations:  Dental -- Up to date  Vision -- Up to date   Health Maintenance  Topic Date Due  . Hepatitis C Screening  03/18/1956  . HIV Screening  05/31/1972  . INFLUENZA VACCINE  05/09/2017  . COLONOSCOPY  08/29/2017  . TETANUS/TDAP  02/11/2020     There is no immunization history on file for this patient.   No Known Allergies   Outpatient Medications Prior to Visit  Medication Sig Dispense Refill  . atorvastatin (LIPITOR) 20 MG tablet take 1 tablet by mouth once daily 90 tablet 0  . MULTIPLE VITAMIN PO Take by mouth daily.      . Omega-3 Fatty Acids (FISH OIL) 1000 MG CAPS Take by mouth daily.      Marland Kitchen PAXIL CR 12.5 MG 24 hr tablet Take 1 tablet (12.5 mg total) by mouth every morning. Needs office visit for more refills 30 tablet 0  . sildenafil (VIAGRA) 100 MG tablet Take 1 tablet (100 mg total) by mouth daily as needed. 10 tablet 5  . PAXIL CR 12.5 MG 24 hr tablet TAKE 1 BY MOUTH EVERY MORNING 90 tablet 0   No facility-administered medications prior to visit.      Past Medical History:  Diagnosis Date  . Allergic rhinitis   . Asthma   . Hyperlipemia      Past Surgical History:  Procedure Laterality Date  . TONSILLECTOMY    . VASECTOMY       History reviewed. No pertinent family history.   Social History   Social History  . Marital status: Married    Spouse name: bridgett  . Number of children: 4  . Years of education: 47   Occupational History  . [employer]  Self Employed   Social History Main Topics  . Smoking status: Never Smoker  . Smokeless tobacco: Never Used  . Alcohol use 3.6 - 4.2 oz/week    3 Standard drinks or equivalent, 3 - 4 Glasses of wine per week  . Drug use: No  . Sexual activity: Yes    Partners: Female   Other Topics Concern  . 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  2 sons- eldest in business with him;  2 daughters- eldest in fashion, younger interested in veteranarian medicine.  Marriage- in good health.  Riding "murdur" cycle but less than before.      Review of Systems  Constitutional: Denies fever, chills, fatigue, or significant weight gain/loss. HENT: Head: Denies headache or neck pain Ears: Denies changes in hearing, ringing in ears, earache, drainage Nose: Denies discharge, stuffiness, itching, nosebleed, sinus pain Throat: Denies sore throat, hoarseness, dry mouth, sores, thrush Eyes: Denies loss/changes in vision, pain, redness, blurry/double vision, flashing lights Cardiovascular: Denies chest pain/discomfort, tightness, palpitations, shortness of breath with activity, difficulty lying down, swelling, sudden awakening with shortness of  breath Respiratory: Denies shortness of breath, cough, sputum production, wheezing Gastrointestinal: Denies dysphasia, heartburn, change in appetite, nausea, change in bowel habits, rectal bleeding, constipation, diarrhea, yellow skin or eyes Genitourinary: Denies frequency, urgency, burning/pain, blood in urine, incontinence, change in urinary strength. Musculoskeletal: Denies muscle/joint pain, stiffness, back pain, redness or swelling of joints, trauma Skin: Denies rashes, lumps, itching, dryness, color changes, or hair/nail changes Neurological: Denies dizziness, fainting, seizures, weakness, numbness, tingling, tremor Psychiatric - Denies nervousness, stress, depression or memory loss Endocrine: Denies heat or cold intolerance,  sweating, frequent urination, excessive thirst, changes in appetite Hematologic: Denies ease of bruising or bleeding     Objective:     BP 110/68 (BP Location: Left Arm, Patient Position: Sitting, Cuff Size: Large)   Pulse 65   Temp 98 F (36.7 C) (Oral)   Resp 16   Ht 6\' 7"  (2.007 m)   Wt 238 lb (108 kg)   SpO2 97%   BMI 26.81 kg/m  Nursing note and vital signs reviewed.  Physical Exam  Constitutional: He is oriented to person, place, and time. He appears well-developed and well-nourished.  HENT:  Head: Normocephalic.  Right Ear: Hearing, tympanic membrane, external ear and ear canal normal.  Left Ear: Hearing, tympanic membrane, external ear and ear canal normal.  Nose: Nose normal.  Mouth/Throat: Uvula is midline, oropharynx is clear and moist and mucous membranes are normal.  Eyes: Conjunctivae and EOM are normal. Pupils are equal, round, and reactive to light.  Neck: Neck supple. No JVD present. No tracheal deviation present. No thyromegaly present.  Cardiovascular: Normal rate, regular rhythm, normal heart sounds and intact distal pulses.   Pulmonary/Chest: Effort normal and breath sounds normal.  Abdominal: Soft. Bowel sounds are normal. He exhibits no distension and no mass. There is no tenderness. There is no rebound and no guarding.  Musculoskeletal: Normal range of motion. He exhibits no edema or tenderness.  Lymphadenopathy:    He has no cervical adenopathy.  Neurological: He is alert and oriented to person, place, and time. He has normal reflexes. No cranial nerve deficit. He exhibits normal muscle tone. Coordination normal.  Skin: Skin is warm and dry.  Psychiatric: He has a normal mood and affect. His behavior is normal. Judgment and thought content normal.       Assessment & Plan:   Problem List Items Addressed This Visit      Respiratory   Sleep apnea    Patient with concern for possible sleep apnea given snoring at night and witnessed apneic events.  Denies hypersomnia or excessive daytime sleepiness. Referral placed for sleep medicine to evaluate sleep study for potential sleep apnea.      Relevant Orders   Ambulatory referral to Neurology     Other   Routine health maintenance - Primary    1) Anticipatory Guidance: Discussed importance of wearing a seatbelt while driving and not texting while driving; changing batteries in smoke detector at least once annually; wearing suntan lotion when outside; eating a balanced and moderate diet; getting physical activity at least 30 minutes per day.  2) Immunizations / Screenings / Labs:  All immunizations are up-to-date per recommendations. Obtain hepatitis C antibody for hepatitis C screening. Will be due for colon cancer screening in November. Stool cards provided. Obtain prostate specific antigen for prostate cancer screening. All other screenings are up-to-date per recommendations. Obtain CBC, CMET, and lipid profile.    Overall well exam with risk factors for cardiovascular disease including hyperlipidemia. He does have  concern for sleep apnea as he has had witnessed episodes of apneic periods with snoring at night. Referral to sleep medicine provided for sleep study. Encouraged to continue eating a nutritional intake that is moderate, balance, and varied. Continue physical activity. He is of adequate weight. Continue other healthy lifestyle behaviors and choices. Follow-up prevention exam in 1 year. Follow-up office visit pending blood work and for chronic conditions.       Relevant Orders   CBC   Comprehensive metabolic panel   PSA   Lipid panel   Hemoccult Cards (X3 cards)    Other Visit Diagnoses    Other problems related to lifestyle       Relevant Orders   Hepatitis C antibody       I am having Mr. Homeyer maintain his MULTIPLE VITAMIN PO, Fish Oil, sildenafil, atorvastatin, and PAXIL CR.   Follow-up: Return in about 6 months (around 07/21/2017), or if symptoms worsen or fail  to improve.   Mauricio Po, FNP

## 2017-01-19 NOTE — Assessment & Plan Note (Signed)
Patient with concern for possible sleep apnea given snoring at night and witnessed apneic events. Denies hypersomnia or excessive daytime sleepiness. Referral placed for sleep medicine to evaluate sleep study for potential sleep apnea.

## 2017-01-19 NOTE — Assessment & Plan Note (Signed)
1) Anticipatory Guidance: Discussed importance of wearing a seatbelt while driving and not texting while driving; changing batteries in smoke detector at least once annually; wearing suntan lotion when outside; eating a balanced and moderate diet; getting physical activity at least 30 minutes per day.  2) Immunizations / Screenings / Labs:  All immunizations are up-to-date per recommendations. Obtain hepatitis C antibody for hepatitis C screening. Will be due for colon cancer screening in November. Stool cards provided. Obtain prostate specific antigen for prostate cancer screening. All other screenings are up-to-date per recommendations. Obtain CBC, CMET, and lipid profile.    Overall well exam with risk factors for cardiovascular disease including hyperlipidemia. He does have concern for sleep apnea as he has had witnessed episodes of apneic periods with snoring at night. Referral to sleep medicine provided for sleep study. Encouraged to continue eating a nutritional intake that is moderate, balance, and varied. Continue physical activity. He is of adequate weight. Continue other healthy lifestyle behaviors and choices. Follow-up prevention exam in 1 year. Follow-up office visit pending blood work and for chronic conditions.

## 2017-01-19 NOTE — Patient Instructions (Addendum)
Thank you for choosing [facility].  SUMMARY AND INSTRUCTIONS:  Please continue to take your medications as prescribed.  Complete the stool card to check for blood.   Schedule your colonoscopy.  They will call with your referral to sleep medicine.   Medication:  Your prescription(s) have been submitted to your pharmacy or been printed and provided for you. Please take as directed and contact our office if you believe you are having problem(s) with the medication(s) or have any questions.  Labs:  Please stop by the lab on the lower level of the building for your blood work. Your results will be released to [place] (or called to you) after review, usually within 72 hours after test completion. If any changes need to be made, you will be notified at that same time.  1.) The lab is open from 7:30am to 5:30 pm Monday-Friday 2.) No appointment is necessary 3.) Fasting (if needed) is 6-8 hours after food and drink; black coffee and water are okay   Referrals:  Referrals have been made during this visit. You should expect to hear back from our schedulers in about 7-10 days in regards to establishing an appointment with the specialists we discussed.   Follow up:  If your symptoms worsen or fail to improve, please contact our office for further instruction, or in case of emergency go directly to the emergency room at the closest medical facility.     Health Maintenance, Male A healthy lifestyle and preventive care is important for your health and wellness. Ask your health care provider about what schedule of regular examinations is right for you. What should I know about weight and diet?  Eat a Healthy Diet  Eat plenty of vegetables, fruits, whole grains, low-fat dairy products, and lean protein.  Do not eat a lot of foods high in solid fats, added sugars, or salt. Maintain a Healthy Weight  Regular exercise can help you achieve or maintain a healthy weight. You should:  Do  at least 150 minutes of exercise each week. The exercise should increase your heart rate and make you sweat (moderate-intensity exercise).  Do strength-training exercises at least twice a week. Watch Your Levels of Cholesterol and Blood Lipids  Have your blood tested for lipids and cholesterol every 5 years starting at 60 years of age. If you are at high risk for heart disease, you should start having your blood tested when you are 60 years old. You may need to have your cholesterol levels checked more often if:  Your lipid or cholesterol levels are high.  You are older than 60 years of age.  You are at high risk for heart disease. What should I know about cancer screening? Many types of cancers can be detected early and may often be prevented. Lung Cancer  You should be screened every year for lung cancer if:  You are a current smoker who has smoked for at least 30 years.  You are a former smoker who has quit within the past 15 years.  Talk to your health care provider about your screening options, when you should start screening, and how often you should be screened. Colorectal Cancer  Routine colorectal cancer screening usually begins at 60 years of age and should be repeated every 5-10 years until you are 60 years old. You may need to be screened more often if early forms of precancerous polyps or small growths are found. Your health care provider may recommend screening at an earlier age if  you have risk factors for colon cancer.  Your health care provider may recommend using home test kits to check for hidden blood in the stool.  A small camera at the end of a tube can be used to examine your colon (sigmoidoscopy or colonoscopy). This checks for the earliest forms of colorectal cancer. Prostate and Testicular Cancer  Depending on your age and overall health, your health care provider may do certain tests to screen for prostate and testicular cancer.  Talk to your health care  provider about any symptoms or concerns you have about testicular or prostate cancer. Skin Cancer  Check your skin from head to toe regularly.  Tell your health care provider about any new moles or changes in moles, especially if:  There is a change in a mole's size, shape, or color.  You have a mole that is larger than a pencil eraser.  Always use sunscreen. Apply sunscreen liberally and repeat throughout the day.  Protect yourself by wearing long sleeves, pants, a wide-brimmed hat, and sunglasses when outside. What should I know about heart disease, diabetes, and high blood pressure?  If you are 69-32 years of age, have your blood pressure checked every 3-5 years. If you are 30 years of age or older, have your blood pressure checked every year. You should have your blood pressure measured twice-once when you are at a hospital or clinic, and once when you are not at a hospital or clinic. Record the average of the two measurements. To check your blood pressure when you are not at a hospital or clinic, you can use:  An automated blood pressure machine at a pharmacy.  A home blood pressure monitor.  Talk to your health care provider about your target blood pressure.  If you are between 1-47 years old, ask your health care provider if you should take aspirin to prevent heart disease.  Have regular diabetes screenings by checking your fasting blood sugar level.  If you are at a normal weight and have a low risk for diabetes, have this test once every three years after the age of 37.  If you are overweight and have a high risk for diabetes, consider being tested at a younger age or more often.  A one-time screening for abdominal aortic aneurysm (AAA) by ultrasound is recommended for men aged 35-75 years who are current or former smokers. What should I know about preventing infection? Hepatitis B  If you have a higher risk for hepatitis B, you should be screened for this virus. Talk  with your health care provider to find out if you are at risk for hepatitis B infection. Hepatitis C  Blood testing is recommended for:  Everyone born from [birth year] through 1964.  Anyone with known risk factors for hepatitis C. Sexually Transmitted Diseases (STDs)  You should be screened each year for STDs including gonorrhea and chlamydia if:  You are sexually active and are younger than 60 years of age.  You are older than 60 years of age and your health care provider tells you that you are at risk for this type of infection.  Your sexual activity has changed since you were last screened and you are at an increased risk for chlamydia or gonorrhea. Ask your health care provider if you are at risk.  Talk with your health care provider about whether you are at high risk of being infected with HIV. Your health care provider may recommend a prescription medicine to help prevent HIV infection.  What else can I do?  Schedule regular health, dental, and eye exams.  Stay current with your vaccines (immunizations).  Do not use any tobacco products, such as cigarettes, chewing tobacco, and e-cigarettes. If you need help quitting, ask your health care provider.  Limit alcohol intake to no more than 2 drinks per day. One drink equals 12 ounces of beer, 5 ounces of wine, or 1 ounces of hard liquor.  Do not use street drugs.  Do not share needles.  Ask your health care provider for help if you need support or information about quitting drugs.  Tell your health care provider if you often feel depressed.  Tell your health care provider if you have ever been abused or do not feel safe at home. This information is not intended to replace advice given to you by your health care provider. Make sure you discuss any questions you have with your health care provider. Document Released: 03/23/2008 Document Revised: 05/24/2016 Document Reviewed: 06/29/2015 Elsevier Interactive Patient Education  2017  [employer].

## 2017-01-25 ENCOUNTER — Encounter: Payer: Self-pay | Admitting: Family

## 2017-01-25 ENCOUNTER — Other Ambulatory Visit (INDEPENDENT_AMBULATORY_CARE_PROVIDER_SITE_OTHER): Payer: BLUE CROSS/BLUE SHIELD

## 2017-01-25 ENCOUNTER — Other Ambulatory Visit: Payer: Self-pay

## 2017-01-25 DIAGNOSIS — Z7289 Other problems related to lifestyle: Secondary | ICD-10-CM

## 2017-01-25 DIAGNOSIS — Z0000 Encounter for general adult medical examination without abnormal findings: Secondary | ICD-10-CM

## 2017-01-25 LAB — CBC
HCT: 46.3 % (ref 39.0–52.0)
HEMOGLOBIN: 15.9 g/dL (ref 13.0–17.0)
MCHC: 34.3 g/dL (ref 30.0–36.0)
MCV: 89.1 fl (ref 78.0–100.0)
PLATELETS: 223 10*3/uL (ref 150.0–400.0)
RBC: 5.2 Mil/uL (ref 4.22–5.81)
RDW: 13.6 % (ref 11.5–15.5)
WBC: 6 10*3/uL (ref 4.0–10.5)

## 2017-01-25 LAB — COMPREHENSIVE METABOLIC PANEL
ALBUMIN: 4.7 g/dL (ref 3.5–5.2)
ALT: 17 U/L (ref 0–53)
AST: 16 U/L (ref 0–37)
Alkaline Phosphatase: 43 U/L (ref 39–117)
BILIRUBIN TOTAL: 1.1 mg/dL (ref 0.2–1.2)
BUN: 13 mg/dL (ref 6–23)
CALCIUM: 9.9 mg/dL (ref 8.4–10.5)
CO2: 31 mEq/L (ref 19–32)
Chloride: 105 mEq/L (ref 96–112)
Creatinine, Ser: 0.98 mg/dL (ref 0.40–1.50)
GFR: 83.02 mL/min (ref >60.00)
Glucose, Bld: 106 mg/dL — ABNORMAL HIGH (ref 70–99)
Potassium: 4.5 mEq/L (ref 3.5–5.1)
Sodium: 142 mEq/L (ref 135–145)
TOTAL PROTEIN: 6.8 g/dL (ref 6.0–8.3)

## 2017-01-25 LAB — FECAL OCCULT BLOOD, IMMUNOCHEMICAL: Fecal Occult Bld: NEGATIVE

## 2017-01-25 LAB — LIPID PANEL
CHOLESTEROL: 213 mg/dL — AB (ref 0–200)
HDL: 62.3 mg/dL (ref >39.00)
LDL Cholesterol: 126 mg/dL — ABNORMAL HIGH (ref 0–99)
NonHDL: 150.43
TRIGLYCERIDES: 121 mg/dL (ref 0.0–149.0)
Total CHOL/HDL Ratio: 3
VLDL: 24.2 mg/dL (ref 0.0–40.0)

## 2017-01-25 LAB — HEPATITIS C ANTIBODY: HCV AB: NEGATIVE

## 2017-01-25 LAB — PSA: PSA: 2.04 ng/mL (ref 0.10–4.00)

## 2017-02-07 ENCOUNTER — Encounter: Payer: Self-pay | Admitting: Neurology

## 2017-02-07 ENCOUNTER — Ambulatory Visit (INDEPENDENT_AMBULATORY_CARE_PROVIDER_SITE_OTHER): Payer: BLUE CROSS/BLUE SHIELD | Admitting: Neurology

## 2017-02-07 VITALS — BP 132/60 | HR 62 | Resp 16 | Ht >= 80 in | Wt 235.0 lb

## 2017-02-07 DIAGNOSIS — F411 Generalized anxiety disorder: Secondary | ICD-10-CM

## 2017-02-07 DIAGNOSIS — R0681 Apnea, not elsewhere classified: Secondary | ICD-10-CM | POA: Diagnosis not present

## 2017-02-07 DIAGNOSIS — R0683 Snoring: Secondary | ICD-10-CM | POA: Diagnosis not present

## 2017-02-07 NOTE — Patient Instructions (Signed)

## 2017-02-07 NOTE — Progress Notes (Signed)
Subjective:    Patient ID: Zachary [name] is a 61 y.o. male.  HPI     Star Age, MD, PhD [facility] [address] P.O. [place], [place] [address]  Dear Zachary [name],  I saw your patient, Zachary [name], upon your kind request in my neurologic clinic today for initial consultation of his sleep disorder, in particular, concern for underlying obstructive sleep apnea. The patient is unaccompanied today. As you know, Zachary [name] is a 61 year old right-handed gentleman with an underlying medical history of asthma, allergic rhinitis, hyperlipidemia, anxiety and overweight state, who reports snoring and witnessed apneic breathing pauses while asleep, per wife's report. I reviewed your office note from 01/19/2017. His Epworth sleepiness score is 5 out of 24, fatigue score is 9 out of 63. He lives at home with his wife. He has 4 children. He is a nonsmoker and drinks alcohol about 4-5 drinks per week. He drinks 20 ounces of coffee in the morning typically. He denies was leg symptoms. She denies night to night nocturia. His bedtime is usually around 10 PM to 10:30 PM. Wakeup time between 6 and 7. He denies morning headaches typically. His wife has noted apneic pauses while he is asleep and also captured and on her cell phone. He is not aware of any family history of OSA. His children are grown. He has 2 grandchildren. He owns his own business. He has a longer standing history of anxiety of at least 10 years, currently well managed on Paxil CR. In the past, he would wake up in the morning hours with anxiety and difficulty falling back asleep. He was tried on Ambien some 10 years ago but when he was started on Paxil, the Ambien was discontinued and he has been doing reasonably well on Paxil long-acting, low-dose. His weight has remained the same. He is not aware of any leg twitching in his sleep or parasomnias. He has been snoring for years, probably worse now.  His Past Medical  History Is Significant For: Past Medical History:  Diagnosis Date  . Allergic rhinitis   . Asthma   . Hyperlipemia     His Past Surgical History Is Significant For: Past Surgical History:  Procedure Laterality Date  . TONSILLECTOMY    . VASECTOMY      His Family History Is Significant For: No family history on file.  His Social History Is Significant For: Social History   Social History  . Marital status: Married    Spouse name: Zachary [name]  . Number of children: 4  . Years of education: College   Occupational History  . [employer]   Social History Main Topics  . Smoking status: Never Smoker  . Smokeless tobacco: Never Used  . Alcohol use 3.6 - 4.2 oz/week    3 Standard drinks or equivalent, 3 - 4 Glasses of wine per week  . Drug use: No  . Sexual activity: Yes    Partners: Female   Other Topics Concern  . None   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  2 sons- eldest in business with him;  2 daughters- eldest in fashion, younger interested in veteranarian medicine.  Marriage- in good health.  Riding "murdur" cycle but less than before.      Drinks 20oz of caffeine a day     His Allergies Are:  No Known Allergies:   His Current Medications Are:  Outpatient Encounter Prescriptions as of 02/07/2017  Medication Sig  . atorvastatin (LIPITOR) 20 MG tablet take 1 tablet by mouth once daily  . MULTIPLE VITAMIN PO Take by mouth daily.    . Omega-3 Fatty Acids (FISH OIL) 1000 MG CAPS Take by mouth daily.    Marland Kitchen PAXIL CR 12.5 MG 24 hr tablet Take 1 tablet (12.5 mg total) by mouth every morning. Needs office visit for more refills  . sildenafil (VIAGRA) 100 MG tablet Take 1 tablet (100 mg total) by mouth daily as needed.   No facility-administered encounter medications on file as of 02/07/2017.   :  Review of Systems:  Out of a complete 14 point review of systems, all are reviewed and negative with the exception of these  symptoms as listed below: Review of Systems  Neurological:       Patient states that he wakes up once a night, snores, witnessed apnea, denies taking naps.   Epworth Sleepiness Scale 0= would never doze 1= slight chance of dozing 2= moderate chance of dozing 3= high chance of dozing  Sitting and reading:1 Watching TV:1 Sitting inactive in a public place (ex. Theater or meeting):0 As a passenger in a car for an hour without a break:1 Lying down to rest in the afternoon:2 Sitting and talking to someone:0 Sitting quietly after lunch (no alcohol):0 In a car, while stopped in traffic:0 Total:5   Objective:  Neurologic Exam  Physical Exam Physical Examination:   Vitals:   02/07/17 1622  BP: 132/60  Pulse: 62  Resp: 16    General Examination: The patient is a very pleasant 61 y.o. male in no acute distress. He appears well-developed and well-nourished and well groomed.   HEENT: Normocephalic, atraumatic, pupils are equal, round and reactive to light and accommodation. Funduscopic exam is normal with sharp disc margins noted. Extraocular tracking is good without limitation to gaze excursion or nystagmus noted. Normal smooth pursuit is noted. Hearing is grossly intact. Face is symmetric with normal facial animation and normal facial sensation. Speech is clear with no dysarthria noted. There is no hypophonia. There is no lip, neck/head, jaw or voice tremor. Neck is supple with full range of passive and active motion. There are no carotid bruits on auscultation. Oropharynx exam reveals: mild mouth dryness, adequate dental hygiene and mild airway crowding, due to smaller airway entry and redundant soft palate, tip of uvula difficult to see. Tonsils are absent. Mallampati is class II. Tongue protrudes centrally and palate elevates symmetrically. Neck size is 16 inches. He has a minimal overbite.   Chest: Clear to auscultation without wheezing, rhonchi or crackles noted.  Heart: S1+S2+0,  regular and normal without murmurs, rubs or gallops noted.   Abdomen: Soft, non-tender and non-distended with normal bowel sounds appreciated on auscultation.  Extremities: There is no pitting edema in the distal lower extremities bilaterally. Pedal pulses are intact.  Skin: Warm and dry without trophic changes noted.  Musculoskeletal: exam reveals no obvious joint deformities, tenderness or joint swelling or erythema.   Neurologically:  Mental status: The patient is awake, alert and oriented in all 4 spheres. His immediate and remote memory, attention, language skills and fund of knowledge are appropriate. There is no evidence of aphasia, agnosia, apraxia or anomia. Speech is clear with normal prosody and enunciation. Thought process is linear. Mood is normal and affect is normal.  Cranial nerves II - XII are as described above under HEENT exam. In addition: shoulder shrug is normal with equal shoulder height noted. Motor exam: Normal bulk, strength  and tone is noted. There is no drift, tremor or rebound. Romberg is negative. Reflexes are 1+ throughout. Fine motor skills and coordination: intact with normal finger taps, normal hand movements, normal rapid alternating patting, normal foot taps and normal foot agility.  Cerebellar testing: No dysmetria or intention tremor on finger to nose testing. Heel to shin is unremarkable bilaterally. There is no truncal or gait ataxia.  Sensory exam: intact to light touch in the upper and lower extremities.  Gait, station and balance: He stands easily. No veering to one side is noted. No leaning to one side is noted. Posture is age-appropriate and stance is narrow based. Gait shows normal stride length and normal pace. No problems turning are noted. Tandem walk is unremarkable.  Assessment and Plan:  In summary, EWART CARRERA is a very pleasant 61 y.o.-year old male with an underlying medical history of asthma, allergic rhinitis, hyperlipidemia, anxiety and  overweight state, whose history and physical exam are concerning for obstructive sleep apnea (OSA). I had a long chat with the patient about my findings and the diagnosis of OSA, its prognosis and treatment options. We talked about medical treatments, surgical interventions and non-pharmacological approaches. I explained in particular the risks and ramifications of untreated moderate to severe OSA, especially with respect to developing cardiovascular disease down the Road, including congestive heart failure, difficult to treat hypertension, cardiac arrhythmias, or stroke. Even type 2 diabetes has, in part, been linked to untreated OSA. Symptoms of untreated OSA include daytime sleepiness, memory problems, mood irritability and mood disorder such as depression and anxiety, lack of energy, as well as recurrent headaches, especially morning headaches. We talked about trying to maintain a healthy lifestyle in general, as well as the importance of weight control. I encouraged the patient to eat healthy, exercise daily and keep well hydrated, to keep a scheduled bedtime and wake time routine, to not skip any meals and eat healthy snacks in between meals. I advised the patient not to drive when feeling sleepy. I recommended the following at this time: sleep study with potential positive airway pressure titration. (We will score hypopneas at 3%).   I explained the sleep test procedure to the patient and also outlined possible surgical and non-surgical treatment options of OSA, including the use of a custom-made dental device (which would require a referral to a specialist dentist or oral surgeon), upper airway surgical options, such as pillar implants, radiofrequency surgery, tongue base surgery, and UPPP (which would involve a referral to an ENT surgeon). Rarely, jaw surgery such as mandibular advancement may be considered.  I also explained the CPAP treatment option to the patient, who indicated that he would be  willing to try CPAP if the need arises. I explained the importance of being compliant with PAP treatment, not only for insurance purposes but primarily to improve His symptoms, and for the patient's long term health benefit, including to reduce His cardiovascular risks. I answered all his questions today and the patient was in agreement. I would like to see him back after the sleep study is completed and encouraged him to call with any interim questions, concerns, problems or updates.   Thank you very much for allowing me to participate in the care of this nice patient. If I can be of any further assistance to you please do not hesitate to call me at [phone].  Sincerely,   Star Age, MD, PhD

## 2017-02-14 ENCOUNTER — Telehealth: Payer: Self-pay | Admitting: Neurology

## 2017-02-14 DIAGNOSIS — R0681 Apnea, not elsewhere classified: Secondary | ICD-10-CM

## 2017-02-14 DIAGNOSIS — R0683 Snoring: Secondary | ICD-10-CM

## 2017-02-14 DIAGNOSIS — F411 Generalized anxiety disorder: Secondary | ICD-10-CM

## 2017-02-14 NOTE — Telephone Encounter (Signed)
This patient's insurance denied an attended sleep study. I will order home sleep test.  

## 2017-02-22 ENCOUNTER — Other Ambulatory Visit: Payer: Self-pay | Admitting: *Deleted

## 2017-02-22 DIAGNOSIS — G479 Sleep disorder, unspecified: Secondary | ICD-10-CM

## 2017-02-22 DIAGNOSIS — F411 Generalized anxiety disorder: Secondary | ICD-10-CM

## 2017-02-22 MED ORDER — PAXIL CR 12.5 MG PO TB24: 12.5000 mg | ORAL_TABLET | Freq: Every morning | ORAL | 3 refills | Status: DC

## 2017-02-28 ENCOUNTER — Ambulatory Visit (INDEPENDENT_AMBULATORY_CARE_PROVIDER_SITE_OTHER): Payer: BLUE CROSS/BLUE SHIELD | Admitting: Neurology

## 2017-02-28 DIAGNOSIS — G4733 Obstructive sleep apnea (adult) (pediatric): Secondary | ICD-10-CM | POA: Diagnosis not present

## 2017-03-06 NOTE — Procedures (Signed)
  [facility] [address] [place], [place] [address] NAME: Zachary [name] DOB: 11-03-1955 MEDICAL RECORD [id number] DOS: 02/28/17 REFERRING PHYSICIAN: Elias Else Study performed: HST HISTORY: 61 year old man with a history of asthma, allergic rhinitis, hyperlipidemia, anxiety and overweight state, who reports snoring and witnessed apneic breathing pauses while asleep, per wife's report. ESS is 5 out of 24, fatigue score is 9 out of 63. BMI of 24.5. STUDY RESULTS: Total Recording Time: 7h 4m Total Apnea/Hypopnea Index (AHI): 20.2/hour Average Oxygen Saturation: 92% Lowest Oxygen Saturation: 83% Average Heart Rate: 63 bpm  IMPRESSION: Obstructive Sleep Apnea OSA RECOMMENDATION: This home sleep test demonstrates overall moderate obstructive sleep apnea with a total AHI of 20.2/hour and O2 nadir of 83%. Given the patient's medical history and sleep related complaints, treatment with positive airway pressure (in the form of CPAP) is recommended. This will require a full night CPAP titration study for proper treatment settings and mask fitting. Please note that untreated obstructive sleep apnea carries additional perioperative morbidity. Patients with significant obstructive sleep apnea should receive perioperative PAP therapy and the surgeons and particularly the anesthesiologist should be informed of the diagnosis and the severity of the sleep disordered breathing. The patient should be cautioned not to drive, work at heights, or operate dangerous or heavy equipment when tired or sleepy. Review and reiteration of good sleep hygiene measures should be pursued with any patient. The patient and his referring provider will be notified of the test results. The patient will be seen in follow up in sleep clinic at [facility].  I certify that I have reviewed the raw data recording prior to the issuance of this report in accordance with the standards of Accreditation  of the American Academy of Sleep medicine (AASM) Star Age, MD, PhD Diplomat, American Board of Psychiatry and Neurology (Neurology and Sleep Medicine)

## 2017-03-06 NOTE — Addendum Note (Signed)
Addended by: Star Age on: 03/06/2017 06:59 PM   Modules accepted: Orders

## 2017-03-06 NOTE — Progress Notes (Signed)
Patient referred by Terri Piedra, NP, seen by me on 02/07/17, HST on 02/28/17:  Please call and notify the patient that the recent home sleep test did suggest the diagnosis of moderate obstructive sleep apnea and that I recommend treatment for this in the form of CPAP. I will request an overnight sleep study for proper titration and mask fitting. Please explain to patient and arrange for a CPAP titration study. I have placed an order in the chart. Thanks, and please route to [facility] for scheduling.   Star Age, MD, PhD [facility])

## 2017-03-07 ENCOUNTER — Telehealth: Payer: Self-pay | Admitting: Neurology

## 2017-03-07 DIAGNOSIS — G4733 Obstructive sleep apnea (adult) (pediatric): Secondary | ICD-10-CM

## 2017-03-07 NOTE — Telephone Encounter (Signed)
CPAP titration study denied. [place] for [employer]?

## 2017-03-07 NOTE — Telephone Encounter (Signed)
[employer] denied CPAP

## 2017-03-07 NOTE — Telephone Encounter (Signed)
We will set patient up with autoPAP at home, as insurance denied in house titration study for OSA. Pls process order and notify patient and set up FU in 10 weeks or with NP if necessary.

## 2017-03-08 ENCOUNTER — Telehealth: Payer: Self-pay

## 2017-03-08 NOTE — Telephone Encounter (Signed)
I spoke to patient and he is aware of results and recommendations. He is willing to proceed with treatment. I will send orders to [facility]. Patient will get a letter reminding him to make f/u appt and stress the importance of compliance. I will send report to PCP.

## 2017-03-08 NOTE — Telephone Encounter (Signed)
See other phone note

## 2017-03-08 NOTE — Telephone Encounter (Signed)
-----   Message from Star Age, MD sent at 03/06/2017  6:59 PM EDT ----- Patient referred by Terri Piedra, NP, seen by me on 02/07/17, HST on 02/28/17:  Please call and notify the patient that the recent home sleep test did suggest the diagnosis of moderate obstructive sleep apnea and that I recommend treatment for this in the form of CPAP. I will request an overnight sleep study for proper titration and mask fitting. Please explain to patient and arrange for a CPAP titration study. I have placed an order in the chart. Thanks, and please route to [facility] for scheduling.   Star Age, MD, PhD [facility])

## 2017-04-04 DIAGNOSIS — G4733 Obstructive sleep apnea (adult) (pediatric): Secondary | ICD-10-CM | POA: Diagnosis not present

## 2017-04-18 ENCOUNTER — Other Ambulatory Visit: Payer: Self-pay | Admitting: Internal Medicine

## 2017-04-18 DIAGNOSIS — G4730 Sleep apnea, unspecified: Secondary | ICD-10-CM

## 2017-04-18 MED ORDER — ATORVASTATIN CALCIUM 20 MG PO TABS: 20.0000 mg | ORAL_TABLET | Freq: Every day | ORAL | 1 refills | Status: DC

## 2017-05-03 ENCOUNTER — Encounter: Payer: Self-pay | Admitting: Family

## 2017-05-03 DIAGNOSIS — F411 Generalized anxiety disorder: Secondary | ICD-10-CM

## 2017-05-03 DIAGNOSIS — G479 Sleep disorder, unspecified: Secondary | ICD-10-CM

## 2017-05-03 MED ORDER — PAXIL CR 12.5 MG PO TB24: 12.5000 mg | ORAL_TABLET | Freq: Every morning | ORAL | 1 refills | Status: DC

## 2017-05-03 MED ORDER — ATORVASTATIN CALCIUM 20 MG PO TABS: 20.0000 mg | ORAL_TABLET | Freq: Every day | ORAL | 1 refills | Status: DC

## 2017-05-04 DIAGNOSIS — G4733 Obstructive sleep apnea (adult) (pediatric): Secondary | ICD-10-CM | POA: Diagnosis not present

## 2017-05-15 DIAGNOSIS — H35372 Puckering of macula, left eye: Secondary | ICD-10-CM | POA: Diagnosis not present

## 2017-05-15 DIAGNOSIS — H2513 Age-related nuclear cataract, bilateral: Secondary | ICD-10-CM | POA: Diagnosis not present

## 2017-05-15 DIAGNOSIS — H52223 Regular astigmatism, bilateral: Secondary | ICD-10-CM | POA: Diagnosis not present

## 2017-05-15 DIAGNOSIS — H524 Presbyopia: Secondary | ICD-10-CM | POA: Diagnosis not present

## 2017-05-15 DIAGNOSIS — H43393 Other vitreous opacities, bilateral: Secondary | ICD-10-CM | POA: Diagnosis not present

## 2017-06-04 DIAGNOSIS — G4733 Obstructive sleep apnea (adult) (pediatric): Secondary | ICD-10-CM | POA: Diagnosis not present

## 2017-07-05 DIAGNOSIS — G4733 Obstructive sleep apnea (adult) (pediatric): Secondary | ICD-10-CM | POA: Diagnosis not present

## 2017-07-10 ENCOUNTER — Encounter: Payer: Self-pay | Admitting: Family

## 2017-07-10 ENCOUNTER — Telehealth: Payer: Self-pay | Admitting: Neurology

## 2017-07-10 NOTE — Telephone Encounter (Signed)
Appointment Request From: Zachary [name]    With Provider: Star Age, MD [[facility]]    Preferred Date Range: From 07/10/2017 To 07/20/2017    Preferred Times: Any    Reason for visit: Office Visit    Comments:  I'd like to schedule a follow up per your request. [name]

## 2017-07-11 ENCOUNTER — Other Ambulatory Visit: Payer: Self-pay

## 2017-07-11 MED ORDER — SILDENAFIL CITRATE 100 MG PO TABS: 100.0000 mg | ORAL_TABLET | Freq: Every day | ORAL | 5 refills | Status: DC | PRN

## 2017-07-11 NOTE — Telephone Encounter (Signed)
I called patient and explained to him that Dr. Rexene Alberts is booked through November and offered him a sooner appt with Ward Givens, NP. Patient was agreeable to this and OV scheduled for 07/26/17 at 9:00am.

## 2017-07-19 ENCOUNTER — Ambulatory Visit (INDEPENDENT_AMBULATORY_CARE_PROVIDER_SITE_OTHER): Payer: BLUE CROSS/BLUE SHIELD | Admitting: General Practice

## 2017-07-19 DIAGNOSIS — Z23 Encounter for immunization: Secondary | ICD-10-CM | POA: Diagnosis not present

## 2017-07-25 ENCOUNTER — Encounter: Payer: Self-pay | Admitting: Neurology

## 2017-07-26 ENCOUNTER — Encounter: Payer: Self-pay | Admitting: Adult Health

## 2017-07-26 ENCOUNTER — Ambulatory Visit (INDEPENDENT_AMBULATORY_CARE_PROVIDER_SITE_OTHER): Payer: BLUE CROSS/BLUE SHIELD | Admitting: Adult Health

## 2017-07-26 VITALS — BP 144/92 | HR 68 | Wt 238.0 lb

## 2017-07-26 DIAGNOSIS — G4733 Obstructive sleep apnea (adult) (pediatric): Secondary | ICD-10-CM

## 2017-07-26 DIAGNOSIS — Z9989 Dependence on other enabling machines and devices: Secondary | ICD-10-CM | POA: Diagnosis not present

## 2017-07-26 NOTE — Patient Instructions (Signed)
Your Plan:  Continue using CPAP nightly  Mask fitting today  If your symptoms worsen or you develop new symptoms please let us know.   Thank you for coming to see [name] at [facility]. I hope we have been able to provide you high quality care today.  You may receive a patient satisfaction survey over the next few weeks. We would appreciate your feedback and comments so that we may continue to improve ourselves and the health of our patients.

## 2017-07-26 NOTE — Progress Notes (Addendum)
PATIENT: Zachary [name] DOB: Jun 15, 1956  REASON FOR VISIT: follow up HISTORY FROM: patient  HISTORY OF PRESENT ILLNESS: OSA on CPAP Today 07/26/17 Zachary [name] is a 61 year old male with a history of obstructive sleep apnea on CPAP. He returns today for his first compliance visit. His download shows that he uses machine 20 out of 30 days for compliance of 67%. He uses machine greater than 4 hours 6 out of 30 days for compliance of 20%. On average he uses his machine 3 hours and 51 minutes. He is on auto set with a minimum pressure of 7 cm water and maximum pressure of 13 cm water. His residual AHI is 3.3. He does have a leak in the 95th percentile at 37.7 L/m. The patient states that he finds that most days he wakes up with the mask off the next morning. He states that he does feel it leaking and feels that he takes it off in the middle of the night without him knowing. He is currently using the nasal pillows. He reports that his wife has noticed the benefit as he is not snoring. He returns today for an evaluation.  HISTORY 02/07/17: Zachary [name] is a 61 year old right-handed gentleman with an underlying medical history of asthma, allergic rhinitis, hyperlipidemia, anxiety and overweight state, who reports snoring and witnessed apneic breathing pauses while asleep, per wife's report. I reviewed your office note from 01/19/2017. His Epworth sleepiness score is 5 out of 24, fatigue score is 9 out of 63. He lives at home with his wife. He has 4 children. He is a nonsmoker and drinks alcohol about 4-5 drinks per week. He drinks 20 ounces of coffee in the morning typically. He denies was leg symptoms. She denies night to night nocturia. His bedtime is usually around 10 PM to 10:30 PM. Wakeup time between 6 and 7. He denies morning headaches typically. His wife has noted apneic pauses while he is asleep and also captured and on her cell phone. He is not aware of any family history of OSA. His children are  grown. He has 2 grandchildren. He owns his own business. He has a longer standing history of anxiety of at least 10 years, currently well managed on Paxil CR. In the past, he would wake up in the morning hours with anxiety and difficulty falling back asleep. He was tried on Ambien some 10 years ago but when he was started on Paxil, the Ambien was discontinued and he has been doing reasonably well on Paxil long-acting, low-dose. His weight has remained the same. He is not aware of any leg twitching in his sleep or parasomnias. He has been snoring for years, probably worse now.  REVIEW OF SYSTEMS: Out of a complete 14 system review of symptoms, the patient complains only of the following symptoms, and all other reviewed systems are negative.  See HPI  ALLERGIES: No Known Allergies  HOME MEDICATIONS: Outpatient Medications Prior to Visit  Medication Sig Dispense Refill  . atorvastatin (LIPITOR) 20 MG tablet Take 1 tablet (20 mg total) by mouth daily. 90 tablet 1  . MULTIPLE VITAMIN PO Take by mouth daily.      . Omega-3 Fatty Acids (FISH OIL) 1000 MG CAPS Take by mouth daily.      Marland Kitchen PAXIL CR 12.5 MG 24 hr tablet Take 1 tablet (12.5 mg total) by mouth every morning. Needs office visit for more refills 90 tablet 1  . sildenafil (VIAGRA) 100 MG tablet Take 1 tablet (  100 mg total) by mouth daily as needed. 10 tablet 5   No facility-administered medications prior to visit.     PAST MEDICAL HISTORY: Past Medical History:  Diagnosis Date  . Allergic rhinitis   . Asthma   . Hyperlipemia     PAST SURGICAL HISTORY: Past Surgical History:  Procedure Laterality Date  . TONSILLECTOMY    . VASECTOMY      FAMILY HISTORY: No family history on file.  SOCIAL HISTORY: Social History   Social History  . Marital status: Married    Spouse name: bridgett  . Number of children: 4  . Years of education: College   Occupational History  . [employer] Employ[employer]   Social  History Main Topics  . Smoking status: Never Smoker  . Smokeless tobacco: Never Used  . Alcohol use 3.6 - 4.2 oz/week    3 Standard drinks or equivalent, 3 - 4 Glasses of wine per week  . Drug use: No  . Sexual activity: Yes    Partners: Female   Other Topics Concern  . 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  2 sons- eldest in business with him;  2 daughters- eldest in fashion, younger interested in veteranarian medicine.  Marriage- in good health.  Riding "murdur" cycle but less than before.      Drinks 20oz of caffeine a day       PHYSICAL EXAM  Vitals:   07/26/17 0915  BP: (!) 144/92  Pulse: 68  Weight: 238 lb (108 kg)   Body mass index is 24.89 kg/m.  Generalized: Well developed, in no acute distress   Neurological examination  Mentation: Alert oriented to time, place, history taking. Follows all commands speech and language fluent Cranial nerve II-XII: Pupils were equal round reactive to light. Extraocular movements were full, visual field were full on confrontational test. Facial sensation and strength were normal. Uvula tongue midline. Head turning and shoulder shrug  were normal and symmetric.  Neck circumference 16 inches, Mallampati 3+ Motor: The motor testing reveals 5 over 5 strength of all 4 extremities. Good symmetric motor tone is noted throughout.  Sensory: Sensory testing is intact to soft touch on all 4 extremities. No evidence of extinction is noted.  Coordination: Cerebellar testing reveals good finger-nose-finger and heel-to-shin bilaterally.  Gait and station: Gait is normal.  Reflexes: Deep tendon reflexes are symmetric and normal bilaterally.   DIAGNOSTIC DATA (LABS, IMAGING, TESTING) - I reviewed patient records, labs, notes, testing and imaging myself where available.  Lab Results  Component Value Date   WBC 6.0 01/25/2017   HGB 15.9 01/25/2017   HCT 46.3 01/25/2017   MCV 89.1 01/25/2017   PLT 223.0  01/25/2017      Component Value Date/Time   NA 142 01/25/2017 0825   K 4.5 01/25/2017 0825   CL 105 01/25/2017 0825   CO2 31 01/25/2017 0825   GLUCOSE 106 (H) 01/25/2017 0825   BUN 13 01/25/2017 0825   CREATININE 0.98 01/25/2017 0825   CALCIUM 9.9 01/25/2017 0825   PROT 6.8 01/25/2017 0825   ALBUMIN 4.7 01/25/2017 0825   AST 16 01/25/2017 0825   ALT 17 01/25/2017 0825   ALKPHOS 43 01/25/2017 0825   BILITOT 1.1 01/25/2017 0825   GFRNONAA 95 05/19/2008 0826   GFRAA 115 05/19/2008 0826   Lab Results  Component Value Date   CHOL 213 (H) 01/25/2017   HDL 62.30 01/25/2017  LDLCALC 126 (H) 01/25/2017   LDLDIRECT 142.3 01/19/2011   TRIG 121.0 01/25/2017   CHOLHDL 3 01/25/2017       ASSESSMENT AND PLAN 61 y.o. year old male  has a past medical history of Allergic rhinitis; Asthma; and Hyperlipemia. here with:  1. OSA on CPAP  The patient's download indicates that he has suboptimal compliance. The patient does have a significant leak. He will stop by our sleep lab today to have his mask refitted. He is encouraged to try to use the machine nightly and for greater than 4 hours each night. He is advised that if his leak does not improve or he continues to take his mask off in the middle the night he should let us know. He will follow-up in 6 months with Dr. Carmelia Bake, MSN, NP-C 07/26/2017, 9:37 AM [facility] [address], [place], [place] [address] [phone]  I reviewed the above note and documentation by the Nurse Practitioner and agree with the history, physical exam, assessment and plan as outlined above. I was immediately available for face-to-face consultation. Star Age, MD, PhD [facility])

## 2017-08-04 DIAGNOSIS — G4733 Obstructive sleep apnea (adult) (pediatric): Secondary | ICD-10-CM | POA: Diagnosis not present

## 2017-08-27 ENCOUNTER — Encounter: Payer: Self-pay | Admitting: Gastroenterology

## 2017-09-04 ENCOUNTER — Encounter: Payer: Self-pay | Admitting: Gastroenterology

## 2017-09-04 DIAGNOSIS — G4733 Obstructive sleep apnea (adult) (pediatric): Secondary | ICD-10-CM | POA: Diagnosis not present

## 2017-10-04 DIAGNOSIS — G4733 Obstructive sleep apnea (adult) (pediatric): Secondary | ICD-10-CM | POA: Diagnosis not present

## 2017-10-11 ENCOUNTER — Other Ambulatory Visit: Payer: Self-pay | Admitting: *Deleted

## 2017-10-11 DIAGNOSIS — G479 Sleep disorder, unspecified: Secondary | ICD-10-CM

## 2017-10-11 DIAGNOSIS — F411 Generalized anxiety disorder: Secondary | ICD-10-CM

## 2017-10-19 ENCOUNTER — Ambulatory Visit (AMBULATORY_SURGERY_CENTER): Payer: Self-pay | Admitting: *Deleted

## 2017-10-19 ENCOUNTER — Other Ambulatory Visit: Payer: Self-pay

## 2017-10-19 VITALS — Ht >= 80 in | Wt 237.0 lb

## 2017-10-19 DIAGNOSIS — Z1211 Encounter for screening for malignant neoplasm of colon: Secondary | ICD-10-CM

## 2017-10-19 MED ORDER — PEG 3350-KCL-NA BICARB-NACL 420 G PO SOLR: 4000.0000 mL | Freq: Once | ORAL | 0 refills | Status: AC

## 2017-10-19 NOTE — Progress Notes (Signed)
Patient denies any allergies to eggs or soy. Patient denies any problems with anesthesia/sedation. Patient denies any oxygen use at home. Patient denies taking any diet/weight loss medications or blood thinners.  

## 2017-10-23 ENCOUNTER — Encounter: Payer: Self-pay | Admitting: Family

## 2017-10-26 ENCOUNTER — Encounter: Payer: Self-pay | Admitting: Gastroenterology

## 2017-11-02 ENCOUNTER — Other Ambulatory Visit: Payer: Self-pay

## 2017-11-02 ENCOUNTER — Ambulatory Visit (AMBULATORY_SURGERY_CENTER): Payer: BLUE CROSS/BLUE SHIELD | Admitting: Gastroenterology

## 2017-11-02 ENCOUNTER — Encounter: Payer: Self-pay | Admitting: Gastroenterology

## 2017-11-02 VITALS — BP 135/83 | HR 64 | Temp 96.0°F | Resp 10 | Ht >= 80 in | Wt 237.0 lb

## 2017-11-02 DIAGNOSIS — Z1211 Encounter for screening for malignant neoplasm of colon: Secondary | ICD-10-CM | POA: Diagnosis present

## 2017-11-02 DIAGNOSIS — E859 Amyloidosis, unspecified: Secondary | ICD-10-CM | POA: Diagnosis not present

## 2017-11-02 DIAGNOSIS — E854 Organ-limited amyloidosis: Secondary | ICD-10-CM | POA: Diagnosis not present

## 2017-11-02 DIAGNOSIS — K639 Disease of intestine, unspecified: Secondary | ICD-10-CM

## 2017-11-02 MED ORDER — SODIUM CHLORIDE 0.9 % IV SOLN: 500.0000 mL | Freq: Once | INTRAVENOUS | Status: DC

## 2017-11-02 NOTE — Patient Instructions (Signed)

## 2017-11-02 NOTE — Progress Notes (Signed)
Called to room to assist during endoscopic procedure.  Patient ID and intended procedure confirmed with present staff. Received instructions for my participation in the procedure from the performing physician.  

## 2017-11-02 NOTE — Op Note (Signed)
[facility] Patient Name: Zachary [name] Procedure Date: 11/02/2017 2:00 PM MRN: [id number] Endoscopist: Milus Banister , MD Age: 62 Referring MD:  Date of Birth: May 08, 1956 Gender: Male Account #: [id number] Procedure:                Colonoscopy Indications:              Screening for colorectal malignant neoplasm Medicines:                Monitored Anesthesia Care Procedure:                Pre-Anesthesia Assessment:                           - Prior to the procedure, a History and Physical                            was performed, and patient medications and                            allergies were reviewed. The patient's tolerance of                            previous anesthesia was also reviewed. The risks                            and benefits of the procedure and the sedation                            options and risks were discussed with the patient.                            All questions were answered, and informed consent                            was obtained. Prior Anticoagulants: The patient has                            taken no previous anticoagulant or antiplatelet                            agents. ASA Grade Assessment: II - A patient with                            mild systemic disease. After reviewing the risks                            and benefits, the patient was deemed in                            satisfactory condition to undergo the procedure.                           After obtaining informed consent, the colonoscope  was passed under direct vision. Throughout the                            procedure, the patient's blood pressure, pulse, and                            oxygen saturations were monitored continuously. The                            Colonoscope was introduced through the anus and                            advanced to the the cecum, identified by                            appendiceal orifice and  ileocecal valve. The                            colonoscopy was performed without difficulty. The                            patient tolerated the procedure well. The quality                            of the bowel preparation was excellent. The quality                            of the bowel preparation was excellent. The                            ileocecal valve, appendiceal orifice, and rectum                            were photographed. Scope In: 2:08:41 PM Scope Out: 2:27:31 PM Scope Withdrawal Time: 0 hours 16 minutes 12 seconds  Total Procedure Duration: 0 hours 18 minutes 50 seconds  Findings:                 A 2-3cm area of moderately erythematous, friable                            (with contact bleeding) and edematous mucosa was                            found in the distal rectum (scope trauma,                            neoplasm?), the distal edge abuts the internal anal                            verge. Biopsies were taken with a cold forceps for                            histology.  Internal hemorrhoids were found. The hemorrhoids                            were small.                           Multiple small and large-mouthed diverticula were                            found in the left colon.                           The exam was otherwise without abnormality on                            direct and retroflexion views. Complications:            No immediate complications. Estimated blood loss:                            None. Estimated Blood Loss:     Estimated blood loss: none. Impression:               - Abnormal mucosa in the distal rectum. Biopsied                            (atypical appearing polyp, scope or prep trauma?)                           - Internal hemorrhoids.                           - Diverticulosis in the left colon.                           - The examination was otherwise normal on direct                             and retroflexion views. Recommendation:           - Patient has a contact number available for                            emergencies. The signs and symptoms of potential                            delayed complications were discussed with the                            patient. Return to normal activities tomorrow.                            Written discharge instructions were provided to the                            patient.                           -  Resume previous diet.                           - Continue present medications.                           - Await pathology results for final recommendations. Milus Banister, MD 11/02/2017 2:32:50 PM This report has been signed electronically.

## 2017-11-02 NOTE — Progress Notes (Signed)
To PACU, VSS. Report to [email or web address] 

## 2017-11-04 DIAGNOSIS — G4733 Obstructive sleep apnea (adult) (pediatric): Secondary | ICD-10-CM | POA: Diagnosis not present

## 2017-11-05 ENCOUNTER — Telehealth: Payer: Self-pay

## 2017-11-05 ENCOUNTER — Encounter: Payer: Self-pay | Admitting: Internal Medicine

## 2017-11-05 ENCOUNTER — Ambulatory Visit: Payer: BLUE CROSS/BLUE SHIELD | Admitting: Internal Medicine

## 2017-11-05 VITALS — BP 132/84 | HR 76 | Temp 97.9°F | Ht >= 80 in | Wt 236.0 lb

## 2017-11-05 DIAGNOSIS — H9192 Unspecified hearing loss, left ear: Secondary | ICD-10-CM

## 2017-11-05 DIAGNOSIS — G479 Sleep disorder, unspecified: Secondary | ICD-10-CM | POA: Diagnosis not present

## 2017-11-05 DIAGNOSIS — E785 Hyperlipidemia, unspecified: Secondary | ICD-10-CM

## 2017-11-05 DIAGNOSIS — F411 Generalized anxiety disorder: Secondary | ICD-10-CM | POA: Diagnosis not present

## 2017-11-05 DIAGNOSIS — Z114 Encounter for screening for human immunodeficiency virus [HIV]: Secondary | ICD-10-CM | POA: Diagnosis not present

## 2017-11-05 DIAGNOSIS — R739 Hyperglycemia, unspecified: Secondary | ICD-10-CM | POA: Diagnosis not present

## 2017-11-05 DIAGNOSIS — R972 Elevated prostate specific antigen [PSA]: Secondary | ICD-10-CM

## 2017-11-05 MED ORDER — SILDENAFIL CITRATE 100 MG PO TABS: 100.0000 mg | ORAL_TABLET | Freq: Every day | ORAL | 5 refills | Status: DC | PRN

## 2017-11-05 MED ORDER — ATORVASTATIN CALCIUM 20 MG PO TABS: 20.0000 mg | ORAL_TABLET | Freq: Every day | ORAL | 3 refills | Status: DC

## 2017-11-05 MED ORDER — PAXIL CR 12.5 MG PO TB24: 12.5000 mg | ORAL_TABLET | Freq: Every morning | ORAL | 3 refills | Status: DC

## 2017-11-05 NOTE — Assessment & Plan Note (Signed)
C/w eustachian valve dysfxn, for mucinex otc bid, also refer audiology per pt request

## 2017-11-05 NOTE — Telephone Encounter (Signed)
  Follow up Call-  Call back number 11/02/2017  Post procedure Call Back phone  # [phone]  Permission to leave phone message Yes  Some recent data might be hidden     [facility] Angela/Recovery-Follow-up

## 2017-11-05 NOTE — Progress Notes (Signed)
Subjective:    Patient ID: Zachary [name], male    DOB: 1956-09-05, 62 y.o.   MRN: [id number]  HPI  Here to f/u for temporary med refills, plans to establish with PCP soon, but now running out of meds.  Pt denies chest pain, increased sob or doe, wheezing, orthopnea, PND, increased LE swelling, palpitations, dizziness or syncope.  Pt denies new neurological symptoms such as new headache, or facial or extremity weakness or numbness   Pt denies polydipsia, polyuria.  Had recent URI last wk without fever and resolved nasal congestion but still with left ear popping and crackling and mild hearing worsening, asks for audiology referral per wife request. Denies worsening depressive symptoms, suicidal ideation, or panic; has ongoing anxiety, asks for med refill   Past Medical History:  Diagnosis Date  . Allergic rhinitis   . Asthma    not current-no treatment  . Hyperlipemia   . Sleep apnea    CPAP   Past Surgical History:  Procedure Laterality Date  . COLONOSCOPY    . POLYPECTOMY    . TONSILLECTOMY    . VASECTOMY      reports that  has never smoked. he has never used smokeless tobacco. He reports that he drinks about 2.4 oz of alcohol per week. He reports that he does not use drugs. family history is not on file. No Known Allergies Current Outpatient Medications on File Prior to Visit  Medication Sig Dispense Refill  . MULTIPLE VITAMIN PO Take by mouth daily.      . Omega-3 Fatty Acids (FISH OIL) 1000 MG CAPS Take by mouth daily.      . TURMERIC PO Take 1 tablet by mouth daily.     No current facility-administered medications on file prior to visit.    Review of Systems  Constitutional: Negative for other unusual diaphoresis or sweats HENT: Negative for ear discharge or swelling Eyes: Negative for other worsening visual disturbances Respiratory: Negative for stridor or other swelling  Gastrointestinal: Negative for worsening distension or other blood Genitourinary: Negative for  retention or other urinary change Musculoskeletal: Negative for other MSK pain or swelling Skin: Negative for color change or other new lesions Neurological: Negative for worsening tremors and other numbness  Psychiatric/Behavioral: Negative for worsening agitation or other fatigue All other system neg per pt    Objective:   Physical Exam BP 132/84   Pulse 76   Temp 97.9 F (36.6 C) (Oral)   Ht 6\' 10"  (2.083 m)   Wt 236 lb (107 kg)   SpO2 97%   BMI 24.68 kg/m  VS noted,  Constitutional: Pt appears in NAD HENT: Head: NCAT.  Right Ear: External ear normal.  Left Ear: External ear normal.  Eyes: . Pupils are equal, round, and reactive to light. Conjunctivae and EOM are normal Nose: without d/c or deformity Neck: Neck supple. Gross normal ROM Cardiovascular: Normal rate and regular rhythm.   Pulmonary/Chest: Effort normal and breath sounds without rales or wheezing.  Abd:  Soft, NT, ND, + BS, no organomegaly Neurological: Pt is alert. At baseline orientation, motor grossly intact Skin: Skin is warm. No rashes, other new lesions, no LE edema Psychiatric: Pt behavior is normal without agitation  No other exam findings  Lab Results  Component Value Date   WBC 6.0 01/25/2017   HGB 15.9 01/25/2017   HCT 46.3 01/25/2017   PLT 223.0 01/25/2017   GLUCOSE 106 (H) 01/25/2017   CHOL 213 (H) 01/25/2017   TRIG  121.0 01/25/2017   HDL 62.30 01/25/2017   LDLDIRECT 142.3 01/19/2011   LDLCALC 126 (H) 01/25/2017   ALT 17 01/25/2017   AST 16 01/25/2017   NA 142 01/25/2017   K 4.5 01/25/2017   CL 105 01/25/2017   CREATININE 0.98 01/25/2017   BUN 13 01/25/2017   CO2 31 01/25/2017   TSH 1.00 01/19/2011   PSA 2.04 01/25/2017      Assessment & Plan:

## 2017-11-05 NOTE — Patient Instructions (Signed)
There was no wax today; You can take Mucinex (or it's generic off brand) for congestion, and tylenol as needed for pain involving the left ear  You will be contacted regarding the referral for: Audiology  Please continue all other medications as before, and refills have been done if requested.  Please have the pharmacy call with any other refills you may need.  Please continue your efforts at being more active, low cholesterol diet, and weight control.  Please keep your appointments with your specialists as you may have planned  Please return in about 3 months, or sooner if needed, with Lab testing done 3-5 days before

## 2017-11-05 NOTE — Assessment & Plan Note (Signed)
Improved with paxil as well, cont same tx

## 2017-11-05 NOTE — Telephone Encounter (Signed)
  Follow up Call-  Call back number 11/02/2017  Post procedure Call Back phone  # [phone]  Permission to leave phone message Yes  Some recent data might be hidden     [facility] Aziel Morgan/Follow-up

## 2017-11-05 NOTE — Assessment & Plan Note (Signed)
Stable, for paxil cr refill,  to f/u any worsening symptoms or concerns

## 2017-11-05 NOTE — Assessment & Plan Note (Signed)
Was mild uncontrolled, goal ldl < 100, to cont current lipitor 20 mg but may need increased statin for LDL > 100 with next labs

## 2017-11-14 ENCOUNTER — Encounter: Payer: Self-pay | Admitting: Gastroenterology

## 2017-11-15 ENCOUNTER — Telehealth: Payer: Self-pay

## 2017-11-15 NOTE — Telephone Encounter (Signed)
-----   Message from Biagio Borg, MD sent at 11/15/2017 11:56 AM EST ----- Zachary [name] -   Can we try to contact pt and have him come next available?  He has been difficult to contact by phone  Let me know if does not work out, as we'll need to send a letter

## 2017-11-15 NOTE — Telephone Encounter (Signed)
Can you call patient to schedule? Thanks!   Notes recorded by Biagio Borg, MD on 11/15/2017 at 11:56 AM EST Zachary [name] -   Can we try to contact pt and have him come next available? He has been difficult to contact by phone  Let me know if does not work out, as we'll need to send a letter ------  Notes recorded by Biagio Borg, MD on 11/15/2017 at 11:55 AM EST Very sorry, I was mistaken; as chance would have it, a Zachary [name] was seen today, but is not the same Zachary [name]. WIll try to cont to contact ------  Notes recorded by Biagio Borg, MD on 11/15/2017 at 10:15 AM EST Pt seen today, thanks ------  Notes recorded by Milus Banister, MD on 11/15/2017 at 8:49 AM EST  I have left two messages on his mobile VM over the past 3-4 days without a call back. I tried his home phone number, that is a fax machine. I called his work, he is out of the office for another week from today.   Patty, Can you please keep trying to get in touch with him so that I can discuss his colon biopsies.  Dr. [name], This is the man with new diagnosis amyloid. I am not sure when he is scheduled to see you next, I don't see any Epic appointments for him.  Zachary [name], I am trying to get in touch with him.

## 2017-11-16 NOTE — Telephone Encounter (Signed)
Patient has scheduled for 2/18.

## 2017-11-16 NOTE — Telephone Encounter (Signed)
Noted  

## 2017-11-16 NOTE — Telephone Encounter (Signed)
I have left patient a vm on his cell phone to call back to schedule follow up appt with Dr. [name].  Home number is a fax.  I called place of employment.  Stated patient was traveling right now.  They did transfer me to his voice mail.  I left anther vm.  I have also sent patient a mychart message.

## 2017-11-26 ENCOUNTER — Ambulatory Visit: Payer: BLUE CROSS/BLUE SHIELD | Admitting: Internal Medicine

## 2017-11-26 ENCOUNTER — Encounter: Payer: Self-pay | Admitting: Internal Medicine

## 2017-11-26 VITALS — BP 136/84 | HR 71 | Temp 97.8°F | Ht >= 80 in | Wt 238.0 lb

## 2017-11-26 DIAGNOSIS — E859 Amyloidosis, unspecified: Secondary | ICD-10-CM | POA: Diagnosis not present

## 2017-11-26 NOTE — Patient Instructions (Signed)
Please continue all other medications as before, and refills have been done if requested.  Please have the pharmacy call with any other refills you may need.  Please continue your efforts at being more active, low cholesterol diet, and weight control.  Please keep your appointments with your specialists as you may have planned  Please go to the LAB in the Basement (turn left off the elevator) for the tests to be done at your convenience  You will be contacted by phone if any changes need to be made immediately.  Otherwise, you will receive a letter about your results with an explanation, but please check with MyChart first.  Please remember to sign up for MyChart if you have not done so, as this will be important to you in the future with finding out test results, communicating by private email, and scheduling acute appointments online when needed. 

## 2017-11-26 NOTE — Progress Notes (Signed)
   Subjective:    Patient ID: Zachary [name], male    DOB: 1955-11-23, 62 y.o.   MRN: [id number]  HPI  Here to f/u after recent biopsy proven amyloid disease found with colon biopsy; pt already generally versed on nature of problem but is asking about the natural hx and what to expect, as well as any further evaluation or tx options.  Has no specific complaints, "I feel ok". Pt denies chest pain, increased sob or doe, wheezing, orthopnea, PND, increased LE swelling, palpitations, dizziness or syncope.   Past Medical History:  Diagnosis Date  . Allergic rhinitis   . Asthma    not current-no treatment  . Hyperlipemia   . Sleep apnea    CPAP   Past Surgical History:  Procedure Laterality Date  . COLONOSCOPY    . POLYPECTOMY    . TONSILLECTOMY    . VASECTOMY      reports that  has never smoked. he has never used smokeless tobacco. He reports that he drinks about 2.4 oz of alcohol per week. He reports that he does not use drugs. family history is not on file. No Known Allergies Current Outpatient Medications on File Prior to Visit  Medication Sig Dispense Refill  . atorvastatin (LIPITOR) 20 MG tablet Take 1 tablet (20 mg total) by mouth daily. 90 tablet 3  . MULTIPLE VITAMIN PO Take by mouth daily.      . Omega-3 Fatty Acids (FISH OIL) 1000 MG CAPS Take by mouth daily.      Marland Kitchen PAXIL CR 12.5 MG 24 hr tablet Take 1 tablet (12.5 mg total) by mouth every morning. Needs office visit for more refills 90 tablet 3  . sildenafil (VIAGRA) 100 MG tablet Take 1 tablet (100 mg total) by mouth daily as needed. 10 tablet 5  . TURMERIC PO Take 1 tablet by mouth daily.     No current facility-administered medications on file prior to visit.    Review of Systems  Constitutional: Negative for other unusual diaphoresis or sweats HENT: Negative for ear discharge or swelling Eyes: Negative for other worsening visual disturbances Respiratory: Negative for stridor or other swelling  Gastrointestinal:  Negative for worsening distension or other blood Genitourinary: Negative for retention or other urinary change Musculoskeletal: Negative for other MSK pain or swelling Skin: Negative for color change or other new lesions Neurological: Negative for worsening tremors and other numbness  Psychiatric/Behavioral: Negative for worsening agitation or other fatigue All other system neg per pt    Objective:   Physical Exam BP 136/84   Pulse 71   Temp 97.8 F (36.6 C) (Oral)   Ht 6\' 10"  (2.083 m)   Wt 238 lb (108 kg)   SpO2 97%   BMI 24.89 kg/m  VS noted,  Constitutional: Pt appears in NAD HENT: Head: NCAT.  Right Ear: External ear normal.  Left Ear: External ear normal.  Eyes: . Pupils are equal, round, and reactive to light. Conjunctivae and EOM are normal Nose: without d/c or deformity Neck: Neck supple. Gross normal ROM Cardiovascular: Normal rate and regular rhythm.   Pulmonary/Chest: Effort normal and breath sounds without rales or wheezing.  Neurological: Pt is alert. At baseline orientation, motor grossly intact Skin: Skin is warm. No rashes, other new lesions, no LE edema Psychiatric: Pt behavior is normal without agitation , mild nervous No other exam findings    Assessment & Plan:

## 2017-11-26 NOTE — Assessment & Plan Note (Signed)
Recent dx, d/w pt indolent nature of dz, for labs as documented, also refer Hematology for further consideration

## 2017-11-30 ENCOUNTER — Other Ambulatory Visit (INDEPENDENT_AMBULATORY_CARE_PROVIDER_SITE_OTHER): Payer: BLUE CROSS/BLUE SHIELD

## 2017-11-30 DIAGNOSIS — E859 Amyloidosis, unspecified: Secondary | ICD-10-CM

## 2017-11-30 DIAGNOSIS — Z114 Encounter for screening for human immunodeficiency virus [HIV]: Secondary | ICD-10-CM

## 2017-11-30 LAB — CBC WITH DIFFERENTIAL/PLATELET
BASOS PCT: 0.5 % (ref 0.0–3.0)
Basophils Absolute: 0 10*3/uL (ref 0.0–0.1)
EOS ABS: 0.2 10*3/uL (ref 0.0–0.7)
EOS PCT: 4.4 % (ref 0.0–5.0)
HEMATOCRIT: 44.6 % (ref 39.0–52.0)
HEMOGLOBIN: 15.3 g/dL (ref 13.0–17.0)
LYMPHS PCT: 26.4 % (ref 12.0–46.0)
Lymphs Abs: 1.3 10*3/uL (ref 0.7–4.0)
MCHC: 34.3 g/dL (ref 30.0–36.0)
MCV: 89.1 fl (ref 78.0–100.0)
MONOS PCT: 9.3 % (ref 3.0–12.0)
Monocytes Absolute: 0.5 10*3/uL (ref 0.1–1.0)
NEUTROS ABS: 3 10*3/uL (ref 1.4–7.7)
Neutrophils Relative %: 59.4 % (ref 43.0–77.0)
PLATELETS: 214 10*3/uL (ref 150.0–400.0)
RBC: 5 Mil/uL (ref 4.22–5.81)
RDW: 13.1 % (ref 11.5–15.5)
WBC: 5.1 10*3/uL (ref 4.0–10.5)

## 2017-12-04 ENCOUNTER — Encounter: Payer: Self-pay | Admitting: Internal Medicine

## 2017-12-05 ENCOUNTER — Telehealth: Payer: Self-pay

## 2017-12-05 ENCOUNTER — Other Ambulatory Visit: Payer: Self-pay | Admitting: Internal Medicine

## 2017-12-05 ENCOUNTER — Encounter: Payer: Self-pay | Admitting: Hematology and Oncology

## 2017-12-05 ENCOUNTER — Telehealth: Payer: Self-pay | Admitting: Hematology and Oncology

## 2017-12-05 ENCOUNTER — Telehealth: Payer: Self-pay | Admitting: Nurse Practitioner

## 2017-12-05 DIAGNOSIS — G4733 Obstructive sleep apnea (adult) (pediatric): Secondary | ICD-10-CM | POA: Diagnosis not present

## 2017-12-05 DIAGNOSIS — R778 Other specified abnormalities of plasma proteins: Secondary | ICD-10-CM

## 2017-12-05 LAB — PROTEIN ELECTROPHORESIS, SERUM
ALBUMIN ELP: 4.6 g/dL (ref 3.8–4.8)
ALPHA 1: 0.3 g/dL (ref 0.2–0.3)
ALPHA 2: 0.5 g/dL (ref 0.5–0.9)
Abnormal Protein Band1: 0.4 g/dL — ABNORMAL HIGH
BETA 2: 0.3 g/dL (ref 0.2–0.5)
BETA GLOBULIN: 0.4 g/dL (ref 0.4–0.6)
Gamma Globulin: 0.7 g/dL — ABNORMAL LOW (ref 0.8–1.7)
Total Protein: 6.8 g/dL (ref 6.1–8.1)

## 2017-12-05 LAB — HIV ANTIBODY (ROUTINE TESTING W REFLEX): HIV: NONREACTIVE

## 2017-12-05 NOTE — Telephone Encounter (Signed)
Appt has been scheduled for the pt to see Dr. Lebron Conners on 3/18 at [facility] a message to the referring office to notify the pt. Letter mailed.

## 2017-12-05 NOTE — Telephone Encounter (Signed)
-----   Message from Biagio Borg, MD sent at 12/05/2017 12:36 PM EST ----- Left message on MyChart, pt to cont same tx except  The test results show that your current treatment is OK, except the SPEP test does show an abnormal protein in your system that will need to have further consideration per Hematology/Oncology.  I will place the referral and you should hear soon.Redmond Baseman to please inform pt, I will do referral

## 2017-12-05 NOTE — Telephone Encounter (Signed)
Copied from [facility]. Topic: General - Other >> Dec 05, 2017  2:35 PM Juliet Rude, CMA wrote: Reason for CRM: to discuss lab results, ok for [facility] to inform  Pt called back for lab results

## 2017-12-05 NOTE — Telephone Encounter (Signed)
Called pt, LVM.   CRM created.  

## 2017-12-06 NOTE — Telephone Encounter (Signed)
Pt has been informed and expressed understanding. He stated that they have already reached out to schedule an appt for 3/18

## 2017-12-06 NOTE — Telephone Encounter (Signed)
Left message for pt to call office back. Also documented in result note.

## 2017-12-21 DIAGNOSIS — H903 Sensorineural hearing loss, bilateral: Secondary | ICD-10-CM | POA: Diagnosis not present

## 2017-12-24 ENCOUNTER — Encounter: Payer: Self-pay | Admitting: Hematology and Oncology

## 2017-12-24 ENCOUNTER — Other Ambulatory Visit: Payer: Self-pay

## 2017-12-24 ENCOUNTER — Inpatient Hospital Stay: Payer: BLUE CROSS/BLUE SHIELD

## 2017-12-24 ENCOUNTER — Inpatient Hospital Stay: Payer: BLUE CROSS/BLUE SHIELD | Attending: Hematology and Oncology | Admitting: Hematology and Oncology

## 2017-12-24 VITALS — BP 138/88 | HR 64 | Temp 97.7°F | Resp 17 | Wt 238.8 lb

## 2017-12-24 DIAGNOSIS — G4730 Sleep apnea, unspecified: Secondary | ICD-10-CM | POA: Diagnosis not present

## 2017-12-24 DIAGNOSIS — E785 Hyperlipidemia, unspecified: Secondary | ICD-10-CM | POA: Insufficient documentation

## 2017-12-24 DIAGNOSIS — F411 Generalized anxiety disorder: Secondary | ICD-10-CM | POA: Diagnosis not present

## 2017-12-24 DIAGNOSIS — Z79899 Other long term (current) drug therapy: Secondary | ICD-10-CM | POA: Diagnosis not present

## 2017-12-24 DIAGNOSIS — J45909 Unspecified asthma, uncomplicated: Secondary | ICD-10-CM | POA: Diagnosis not present

## 2017-12-24 DIAGNOSIS — E859 Amyloidosis, unspecified: Secondary | ICD-10-CM

## 2017-12-24 DIAGNOSIS — D472 Monoclonal gammopathy: Secondary | ICD-10-CM | POA: Insufficient documentation

## 2017-12-24 LAB — CMP (CANCER CENTER ONLY)
ALBUMIN: 4.6 g/dL (ref 3.5–5.0)
ALT: 30 U/L (ref 0–55)
AST: 19 U/L (ref 5–34)
Alkaline Phosphatase: 49 U/L (ref 40–150)
Anion gap: 9 (ref 3–11)
BUN: 13 mg/dL (ref 7–26)
CO2: 28 mmol/L (ref 22–29)
CREATININE: 1.01 mg/dL (ref 0.70–1.30)
Calcium: 10.3 mg/dL (ref 8.4–10.4)
Chloride: 105 mmol/L (ref 98–109)
GFR, Estimated: >60 mL/min (ref >60)
GLUCOSE: 95 mg/dL (ref 70–140)
Potassium: 4.1 mmol/L (ref 3.5–5.1)
SODIUM: 142 mmol/L (ref 136–145)
Total Bilirubin: 0.9 mg/dL (ref 0.2–1.2)
Total Protein: 7.6 g/dL (ref 6.4–8.3)

## 2017-12-24 LAB — CBC WITH DIFFERENTIAL (CANCER CENTER ONLY)
BASOS PCT: 1 %
Basophils Absolute: 0 10*3/uL (ref 0.0–0.1)
EOS ABS: 0.2 10*3/uL (ref 0.0–0.5)
EOS PCT: 3 %
HCT: 46.4 % (ref 38.4–49.9)
Hemoglobin: 15.8 g/dL (ref 13.0–17.1)
Lymphocytes Relative: 21 %
Lymphs Abs: 1.2 10*3/uL (ref 0.9–3.3)
MCH: 30 pg (ref 27.2–33.4)
MCHC: 33.9 g/dL (ref 32.0–36.0)
MCV: 88.4 fL (ref 79.3–98.0)
Monocytes Absolute: 0.5 10*3/uL (ref 0.1–0.9)
Monocytes Relative: 8 %
NEUTROS PCT: 67 %
Neutro Abs: 4 10*3/uL (ref 1.5–6.5)
PLATELETS: 218 10*3/uL (ref 140–400)
RBC: 5.25 MIL/uL (ref 4.20–5.82)
RDW: 13.2 % (ref 11.0–14.6)
WBC: 5.9 10*3/uL (ref 4.0–10.3)

## 2017-12-24 LAB — TSH: TSH: 0.495 u[IU]/mL (ref 0.320–4.118)

## 2017-12-24 LAB — LACTATE DEHYDROGENASE: LDH: 158 U/L (ref 125–245)

## 2017-12-25 LAB — BETA 2 MICROGLOBULIN, SERUM: BETA 2 MICROGLOBULIN: 1.3 mg/L (ref 0.6–2.4)

## 2017-12-25 LAB — KAPPA/LAMBDA LIGHT CHAINS
KAPPA, LAMDA LIGHT CHAIN RATIO: 0.72 (ref 0.26–1.65)
Kappa free light chain: 5.9 mg/L (ref 3.3–19.4)
Lambda free light chains: 8.2 mg/L (ref 5.7–26.3)

## 2017-12-26 ENCOUNTER — Ambulatory Visit (HOSPITAL_COMMUNITY)
Admission: RE | Admit: 2017-12-26 | Discharge: 2017-12-26 | Disposition: A | Discharge status: Home / Self Care | Payer: BLUE CROSS/BLUE SHIELD | Source: Ambulatory Visit | Attending: Hematology and Oncology | Admitting: Hematology and Oncology

## 2017-12-26 DIAGNOSIS — E785 Hyperlipidemia, unspecified: Secondary | ICD-10-CM | POA: Diagnosis not present

## 2017-12-26 DIAGNOSIS — E859 Amyloidosis, unspecified: Secondary | ICD-10-CM

## 2017-12-26 DIAGNOSIS — G4730 Sleep apnea, unspecified: Secondary | ICD-10-CM | POA: Insufficient documentation

## 2017-12-26 NOTE — Progress Notes (Signed)
  Echocardiogram 2D Echocardiogram has been performed.  Darlina Sicilian M 12/26/2017, 1:59 PM

## 2017-12-27 LAB — PROTEIN ELECTROPHORESIS, SERUM, WITH REFLEX
A/G Ratio: 1.5 (ref 0.7–1.7)
ALPHA-1-GLOBULIN: 0.2 g/dL (ref 0.0–0.4)
ALPHA-2-GLOBULIN: 0.7 g/dL (ref 0.4–1.0)
Albumin ELP: 4.3 g/dL (ref 2.9–4.4)
BETA GLOBULIN: 1.1 g/dL (ref 0.7–1.3)
GAMMA GLOBULIN: 0.9 g/dL (ref 0.4–1.8)
GLOBULIN, TOTAL: 2.8 g/dL (ref 2.2–3.9)
M-Spike, %: 0.4 g/dL — ABNORMAL HIGH
SPEP Interpretation: 0
Total Protein ELP: 7.1 g/dL (ref 6.0–8.5)

## 2017-12-27 LAB — IMMUNOFIXATION REFLEX, SERUM
IgA: 100 mg/dL (ref 90–386)
IgG (Immunoglobin G), Serum: 786 mg/dL (ref 700–1600)
IgM (Immunoglobulin M), Srm: 80 mg/dL (ref 20–172)

## 2017-12-28 DIAGNOSIS — Z79899 Other long term (current) drug therapy: Secondary | ICD-10-CM | POA: Diagnosis not present

## 2017-12-28 DIAGNOSIS — G4730 Sleep apnea, unspecified: Secondary | ICD-10-CM | POA: Diagnosis not present

## 2017-12-28 DIAGNOSIS — E859 Amyloidosis, unspecified: Secondary | ICD-10-CM | POA: Diagnosis not present

## 2017-12-28 DIAGNOSIS — D472 Monoclonal gammopathy: Secondary | ICD-10-CM | POA: Diagnosis not present

## 2017-12-28 DIAGNOSIS — J45909 Unspecified asthma, uncomplicated: Secondary | ICD-10-CM | POA: Diagnosis not present

## 2017-12-28 DIAGNOSIS — F411 Generalized anxiety disorder: Secondary | ICD-10-CM | POA: Diagnosis not present

## 2017-12-28 DIAGNOSIS — E785 Hyperlipidemia, unspecified: Secondary | ICD-10-CM | POA: Diagnosis not present

## 2017-12-31 LAB — UPEP/UIFE/LIGHT CHAINS/TP, 24-HR UR
% BETA, Urine: 0 %
ALPHA 1 URINE: 0 %
Albumin, U: 100 %
Alpha 2, Urine: 0 %
FREE KAPPA LT CHAINS, UR: 4.91 mg/L (ref 1.35–24.19)
FREE KAPPA/LAMBDA RATIO: 10.23 (ref 2.04–10.37)
FREE LAMBDA LT CHAINS, UR: 0.48 mg/L (ref 0.24–6.66)
GAMMA GLOBULIN URINE: 0 %
TOTAL PROTEIN, URINE-UPE24: 6 mg/dL
TOTAL VOLUME: 2800
Total Protein, Urine-Ur/day: 168 mg/24 hr — ABNORMAL HIGH (ref 30–150)

## 2017-12-31 NOTE — Assessment & Plan Note (Signed)
62 y.o. male with incidental discovery of amyloid deposition in the large bowel during a routine screening colonoscopy.  Type of amyloid is not fully established based on the available lab work.  Additional evaluation is necessary to establish whether we are dealing with a light chain amyloidosis and possible presence of multiple myeloma or we are dealing with non-light chain amyloid.  Plan: -Labs today as outlined below. -Echocardiogram. -Return to clinic in 2 weeks to review the findings.

## 2017-12-31 NOTE — Progress Notes (Signed)
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 male with incidental discovery of Zachary deposition in the large bowel during a routine screening colonoscopy.  Type of Zachary is not fully established based on the available lab work.  Additional evaluation is necessary to establish whether we are dealing with a light chain amyloidosis and possible presence of multiple myeloma or we are dealing with non-light chain Zachary.  Plan: -Labs today as outlined below. -Echocardiogram. -Return to clinic in 2 weeks to review the findings.  Voice recognition software was used and creation of this note. Despite my best effort at editing the text, some misspelling/errors may have occurred.  Orders Placed This Encounter  Procedures  . CBC with Differential (Cancer Center Only)    Standing Status:   Future    Number of Occurrences:   1    Standing Expiration Date:   12/25/2018  . CMP ([facility] only)    Standing Status:   Future    Number of Occurrences:   1    Standing Expiration Date:   12/25/2018  . Lactate dehydrogenase (LDH)    Standing Status:   Future    Number of Occurrences:   1    Standing Expiration Date:   12/24/2018  . Beta 2 microglobulin, serum  . Kappa/lambda light chains  . SPEP & IFE with QIG  . TSH    Standing Status:   Future    Number of Occurrences:   1    Standing Expiration Date:   12/25/2018  . ECHOCARDIOGRAM COMPLETE    Standing Status:   Future    Number of Occurrences:   1    Standing Expiration Date:   03/27/2019    Order Specific Question:   Where should this test be performed    Answer:   [facility]    Order Specific Question:   Perflutren DEFINITY (image enhancing agent) should be administered unless hypersensitivity or allergy exist    Answer:   Administer Perflutren    Order Specific Question:   Expected Date:    Answer:   1 week    All questions were answered.  . The patient knows to call the clinic with any  problems, questions or concerns.  This note was electronically signed.    History of Presenting Illness Zachary [name] 62 y.o. presenting to the [facility] for findings of Zachary [name] during routine colonoscopy, referred by Zachary [name].  Patient's past medical history is significant for sleep apnea, allergic rhinitis, generalized anxiety disorder, chronic lower back pain.  Patient was undergoing his second routine screening colonoscopy which led to discovery of a normal findings outlined below in the oncological history.  At the present time, patient reports no active symptoms of any kind.  In particular, he denies any numbness or tingling in his hands or feet.  Denies any shortness of breath, dyspnea with exertion, swelling in the lower extremities.  No change in appetite or bowel habits.  Denies any fevers, chills, night sweats, or weight loss.  No new skeletal complaints.  Oncological/hematological History: **Amyloidosis of [name]: --Labs, 01/25/17: tProt 6.8, Alb 4.7, Ca 9.9, Cr 1.0, AP 43; WBC 6.0, Hgb 15.9, Plt 223 --Screening Colonoscopy (Zachary [name]), 10/23/17: 2-3 cm moderately erythematous friable and edematous area in the distal rectum.  Small internal hemorrhoids, diverticulosis. Pathology --globular/linear deposit of homogeneous material staining positive on Congo red and trichrome, consistent with Zachary deposition. --Labs,  11/30/17: SPEP -- "abnormal protein" 0.4g/dL; WBC 5.1, Hgb 15.3, Plt 214  Medical History: Past Medical History:  Diagnosis Date  . Allergic rhinitis   . Asthma    not current-no treatment  . Hyperlipemia   . Sleep apnea    CPAP    Surgical History: Past Surgical History:  Procedure Laterality Date  . COLONOSCOPY    . POLYPECTOMY    . TONSILLECTOMY    . VASECTOMY      Family History: Family History  Problem Relation Age of Onset  . [name] cancer Neg Hx     Social History: Social History   Socioeconomic History  .  Marital status: Married    Spouse name: bridgett  . Number of children: 4  . Years of education: College  . Highest education level: Not on file  Occupational History  . Occupation: Passenger transport manager: [employer]  Social Needs  . Financial resource strain: Not on file  . Food insecurity:    Worry: Not on file    Inability: Not on file  . Transportation needs:    Medical: Not on file    Non-medical: Not on file  Tobacco Use  . Smoking status: Never Smoker  . Smokeless tobacco: Never Used  Substance and Sexual Activity  . Alcohol use: Yes    Alcohol/week: 2.4 oz    Types: 4 Glasses of wine per week    Comment: weekly -beer & wine   . Drug use: No  . Sexual activity: Yes    Partners: Female  Lifestyle  . Physical activity:    Days per week: Not on file    Minutes per session: Not on file  . Stress: Not on file  Relationships  . Social connections:    Talks on phone: Not on file    Gets together: Not on file    Attends religious service: Not on file    Active member of club or organization: Not on file    Attends meetings of clubs or organizations: Not on file    Relationship status: Not on file  . Intimate partner violence:    Fear of current or ex partner: Not on file    Emotionally abused: Not on file    Physically abused: Not on file    Forced sexual activity: Not on file  Other Topics Concern  . 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  2 sons- eldest in business with him;  2 daughters- eldest in fashion, younger interested in veteranarian medicine.  Marriage- in good health.  Riding "murdur" cycle but less than before.      Drinks 20oz of caffeine a day     Allergies: No Known Allergies  Medications:  Current Outpatient Medications  Medication Sig Dispense Refill  . atorvastatin (LIPITOR) 20 MG tablet Take 1 tablet (20 mg total) by mouth daily. 90 tablet 3  . MULTIPLE VITAMIN PO Take by mouth  daily.      . Omega-3 Fatty Acids (FISH OIL) 1000 MG CAPS Take by mouth daily.      Marland Kitchen PAXIL CR 12.5 MG 24 hr tablet Take 1 tablet (12.5 mg total) by mouth every morning. Needs office visit for more refills 90 tablet 3  . sildenafil (VIAGRA) 100 MG tablet Take 1 tablet (100 mg total) by mouth daily as needed. 10 tablet 5  . TURMERIC PO Take 1 tablet by mouth daily.  No current facility-administered medications for this visit.     Review of Systems: Review of Systems  All other systems reviewed and are negative.    PHYSICAL EXAMINATION Blood pressure 138/88, pulse 64, temperature 97.7 F (36.5 C), temperature source Oral, resp. rate 17, weight 238 lb 12.8 oz (108.3 kg), SpO2 100 %.  ECOG PERFORMANCE STATUS: 0 - Asymptomatic  Physical Exam  Constitutional: He is oriented to person, place, and time and well-developed, well-nourished, and in no distress. No distress.  HENT:  Head: Normocephalic and atraumatic.  Mouth/Throat: Oropharynx is clear and moist. No oropharyngeal exudate.  Eyes: Pupils are equal, round, and reactive to light. Conjunctivae and EOM are normal. No scleral icterus.  Neck: No thyromegaly present.  Cardiovascular: Normal rate, regular rhythm and intact distal pulses.  No murmur heard. Pulmonary/Chest: Effort normal and breath sounds normal. No respiratory distress. He has no wheezes. He has no rales.  Abdominal: Soft. Bowel sounds are normal. He exhibits no distension and no mass. There is no tenderness. There is no guarding.  Musculoskeletal: He exhibits no edema.  Lymphadenopathy:    He has no cervical adenopathy.  Neurological: He is alert and oriented to person, place, and time. He has normal reflexes. No cranial nerve deficit.  Skin: Skin is warm and dry. No rash noted. He is not diaphoretic. No erythema. No pallor.     LABORATORY DATA: I have personally reviewed the data as listed: Clinical Support on 12/24/2017  Component Date Value Ref Range Status   . WBC Count 12/24/2017 5.9  4.0 - 10.3 K/uL Final  . RBC 12/24/2017 5.25  4.20 - 5.82 MIL/uL Final  . Hemoglobin 12/24/2017 15.8  13.0 - 17.1 g/dL Final  . HCT 12/24/2017 46.4  38.4 - 49.9 % Final  . MCV 12/24/2017 88.4  79.3 - 98.0 fL Final  . MCH 12/24/2017 30.0  27.2 - 33.4 pg Final  . MCHC 12/24/2017 33.9  32.0 - 36.0 g/dL Final  . RDW 12/24/2017 13.2  11.0 - 14.6 % Final  . Platelet Count 12/24/2017 218  140 - 400 K/uL Final  . Neutrophils Relative % 12/24/2017 67  % Final  . Neutro Abs 12/24/2017 4.0  1.5 - 6.5 K/uL Final  . Lymphocytes Relative 12/24/2017 21  % Final  . Lymphs Abs 12/24/2017 1.2  0.9 - 3.3 K/uL Final  . Monocytes Relative 12/24/2017 8  % Final  . Monocytes Absolute 12/24/2017 0.5  0.1 - 0.9 K/uL Final  . Eosinophils Relative 12/24/2017 3  % Final  . Eosinophils Absolute 12/24/2017 0.2  0.0 - 0.5 K/uL Final  . Basophils Relative 12/24/2017 1  % Final  . Basophils Absolute 12/24/2017 0.0  0.0 - 0.1 K/uL Final   Performed at [facility], [place] [address], [place], [place] [address]  . Sodium 12/24/2017 142  136 - 145 mmol/L Final  . Potassium 12/24/2017 4.1  3.5 - 5.1 mmol/L Final  . Chloride 12/24/2017 105  98 - 109 mmol/L Final  . CO2 12/24/2017 28  22 - 29 mmol/L Final  . Glucose, Bld 12/24/2017 95  70 - 140 mg/dL Final  . BUN 12/24/2017 13  7 - 26 mg/dL Final  . Creatinine 12/24/2017 1.01  0.70 - 1.30 mg/dL Final  . Calcium 12/24/2017 10.3  8.4 - 10.4 mg/dL Final  . Total Protein 12/24/2017 7.6  6.4 - 8.3 g/dL Final  . Albumin 12/24/2017 4.6  3.5 - 5.0 g/dL Final  . AST 12/24/2017 19  5 - 34 U/L Final  . ALT 12/24/2017 30  0 - 55 U/L Final  . Alkaline Phosphatase 12/24/2017 49  40 - 150 U/L Final  . Total Bilirubin 12/24/2017 0.9  0.2 - 1.2 mg/dL Final  . GFR, Est Non Af Am 12/24/2017 >60  >60 mL/min Final  . GFR, Est AFR Am 12/24/2017 >60  >60 mL/min Final   Comment: (NOTE) The eGFR has been calculated using the CKD EPI  equation. This calculation has not been validated in all clinical situations. eGFR's persistently <60 mL/min signify possible Chronic Kidney Disease.   Georgiann Hahn gap 12/24/2017 9  3 - 11 Final   Performed at [facility], [place] [address] [place], [place] [address]  . LDH 12/24/2017 158  125 - 245 U/L Final   Performed at [facility], [place] [address], [place], [place] [address]  . TSH 12/24/2017 0.495  0.320 - 4.118 uIU/mL Final   Performed at [facility], [place] [address] [place], [place] [address]  . Total Protein ELP 12/24/2017 7.1  6.0 - 8.5 g/dL Final  . Albumin ELP 12/24/2017 4.3  2.9 - 4.4 g/dL Final  . Alpha-1-Globulin 12/24/2017 0.2  0.0 - 0.4 g/dL Final  . Alpha-2-Globulin 12/24/2017 0.7  0.4 - 1.0 g/dL Final  . Beta Globulin 12/24/2017 1.1  0.7 - 1.3 g/dL Final  . Gamma Globulin 12/24/2017 0.9  0.4 - 1.8 g/dL Final  . M-Spike, % 12/24/2017 0.4* Not Observed g/dL Final  . GLOBULIN, TOTAL 12/24/2017 2.8  2.2 - 3.9 g/dL Corrected  . A/G Ratio 12/24/2017 1.5  0.7 - 1.7 Corrected  . Comment 12/24/2017 Comment   Corrected   Comment: (NOTE) Protein electrophoresis scan will follow via computer, mail, or courier delivery.   Marland Kitchen SPEP Interpretation 12/24/2017 .   Final   Comment: (NOTE) Performed At: [facility] [place], [place] [id number] Rush Farmer MD [id number] Performed at [facility], [place] [address] [place], [place] [address]   . IgG (Immunoglobin G), Serum 12/24/2017 786  700 - 1,600 mg/dL Final  . IgA 12/24/2017 100  90 - 386 mg/dL Final  . IgM (Immunoglobulin M), Srm 12/24/2017 80  20 - 172 mg/dL Final  . IFE 1 12/24/2017 Comment   Final   Comment: (NOTE) Immunofixation shows IgG monoclonal protein with lambda light chain specificity. Performed At: [facility] [place], [place] [id number] Rush Farmer MD  [id number] Performed at [facility], [place] [address], [place], [place] [address]   Office Visit on 12/24/2017  Component Date Value Ref Range Status  . Beta-2 Microglobulin 12/24/2017 1.3  0.6 - 2.4 mg/L Final   Comment: (NOTE) Siemens Immulite 2000 Immunochemiluminometric assay (ICMA) Values obtained with different assay methods or kits cannot be used interchangeably. Results cannot be interpreted as absolute evidence of the presence or absence of malignant disease. Performed At: [facility] [place], [place] [id number] Rush Farmer MD [id number] Performed at [facility], [place] [address], [place], [place] [address]   . Kappa free light chain 12/24/2017 5.9  3.3 - 19.4 mg/L Final  . Lamda free light chains 12/24/2017 8.2  5.7 - 26.3 mg/L Final  . Kappa, lamda light chain ratio 12/24/2017 0.72  0.26 - 1.65 Final   Comment: (NOTE) Performed At: [facility] [place], [place] [id number] Rush Farmer MD [id number] Performed at [facility], [place] [address], [place], [place] [address]          Ardath Sax, MD

## 2018-01-02 DIAGNOSIS — G4733 Obstructive sleep apnea (adult) (pediatric): Secondary | ICD-10-CM | POA: Diagnosis not present

## 2018-01-03 ENCOUNTER — Inpatient Hospital Stay: Payer: BLUE CROSS/BLUE SHIELD | Admitting: Hematology and Oncology

## 2018-01-03 ENCOUNTER — Telehealth: Payer: Self-pay

## 2018-01-03 ENCOUNTER — Encounter: Payer: Self-pay | Admitting: Hematology and Oncology

## 2018-01-03 VITALS — BP 158/91 | HR 78 | Temp 97.9°F | Resp 20 | Ht >= 80 in | Wt 243.5 lb

## 2018-01-03 DIAGNOSIS — E859 Amyloidosis, unspecified: Secondary | ICD-10-CM

## 2018-01-03 DIAGNOSIS — J45909 Unspecified asthma, uncomplicated: Secondary | ICD-10-CM | POA: Diagnosis not present

## 2018-01-03 DIAGNOSIS — F411 Generalized anxiety disorder: Secondary | ICD-10-CM | POA: Diagnosis not present

## 2018-01-03 DIAGNOSIS — D472 Monoclonal gammopathy: Secondary | ICD-10-CM | POA: Diagnosis not present

## 2018-01-03 DIAGNOSIS — E785 Hyperlipidemia, unspecified: Secondary | ICD-10-CM | POA: Diagnosis not present

## 2018-01-03 DIAGNOSIS — Z79899 Other long term (current) drug therapy: Secondary | ICD-10-CM | POA: Diagnosis not present

## 2018-01-03 DIAGNOSIS — G4730 Sleep apnea, unspecified: Secondary | ICD-10-CM | POA: Diagnosis not present

## 2018-01-03 NOTE — Telephone Encounter (Signed)
Printed avs and calender of upcoming appointment per 3/28 los

## 2018-01-03 NOTE — Assessment & Plan Note (Signed)
62 y.o. male with incidental discovery of amyloid deposition in the large bowel during a routine screening colonoscopy.  Additional lab work obtained after our last visit in the clinic demonstrated presence of IgG lambda monoclonal gammopathy with no significant abnormalities in calcium, creatinine, or hematological profile.  Our next step evaluation will be assessment for possible presence of multiple myeloma.  Plan: -Consult interventional radiology for bone marrow biopsy -PET/CT to assess for presence of skeletal lesions -Return to clinic in 2 weeks after bone marrow biopsy.

## 2018-01-03 NOTE — Progress Notes (Signed)
Zachary [name]:  Assessment: MGUS (monoclonal gammopathy of unknown significance) 62 y.o. male with incidental discovery of amyloid deposition in the large bowel during a routine screening colonoscopy.  Additional lab work obtained after our last [name] in the clinic demonstrated presence of IgG lambda monoclonal gammopathy with no significant abnormalities in calcium, creatinine, or hematological profile.  Our next step evaluation will be assessment for possible presence of multiple myeloma.  Plan: -Consult interventional radiology for bone marrow biopsy -PET/CT to assess for presence of skeletal lesions -Return to clinic in 2 weeks after bone marrow biopsy.   Voice recognition software was used and creation of this note. Despite my best effort at editing the text, some misspelling/errors may have occurred.  Orders Placed This Encounter  Procedures  . NM PET Image Initial (PI) Whole Body    Standing Status:   Future    Standing Expiration Date:   01/03/2019    Order Specific Question:   If indicated for the ordered procedure, I authorize the administration of a radiopharmaceutical per Radiology protocol    Answer:   Yes    Order Specific Question:   Preferred imaging location?    Answer:   [facility]    Order Specific Question:   Radiology Contrast Protocol - do NOT remove file path    Answer:   \\charchive\epicdata\Radiant\[email or web address]    Order Specific Question:   Reason for Exam additional comments    Answer:   MGUS & amyloidosis -- please eval for evidence of Multiple myeloma  . CT BIOPSY    Order Specific Question:   Lab orders requested (DO NOT place separate lab orders, these will be automatically ordered during procedure specimen collection):    Answer:   Other    Comments:   pathology, flow cyto, cytogentetics and multiple myeloma FISH    Order Specific Question:   Reason for Exam (SYMPTOM  OR DIAGNOSIS REQUIRED)    Answer:    Amyloidosis & MGUS -- multiple myeloma evaluation    Order Specific Question:   Preferred imaging location?    Answer:   [facility]    Order Specific Question:   Radiology Contrast Protocol - do NOT remove file path    Answer:   \\[email or web address]\epicdata\Radiant\CTProtocols.pdf  . CT BONE MARROW BIOPSY & ASPIRATION    Standing Status:   Future    Standing Expiration Date:   04/06/2019    Order Specific Question:   Reason for Exam (SYMPTOM  OR DIAGNOSIS REQUIRED)    Answer:   Amyloidosis & MGUS -- multiple myeloma evaluation    Order Specific Question:   Preferred imaging location?    Answer:   [facility]    Order Specific Question:   Radiology Contrast Protocol - do NOT remove file path    Answer:   \\charchive\epicdata\Radiant\CTProtocols.pdf  . Miscellaneous [facility] test (send-out)    Standing Status:   Future    Standing Expiration Date:   01/04/2019    Order Specific Question:   Test name / description:    Answer:   Amyloid analysis -- colon Bx sample from recent colonoscopy    Cancer Staging No matching staging information was found for the patient.  All questions were answered.  . The patient knows to call the clinic with any problems, questions or concerns.  This note was electronically signed.    History of Presenting Illness Zachary [name] is a 62 y.o. male followed in the Zachary [name] for  MGUS & findings of amyloid deposition in the colon during routine colonoscopy, referred by Zachary Zachary [name].  Patient's past medical history is significant for sleep apnea, allergic rhinitis, generalized anxiety disorder, chronic lower back pain.  Patient was undergoing his second routine screening colonoscopy which led to discovery of a normal findings outlined below in the oncological history.  At the present time, patient reports no active symptoms of any kind.  In particular, he denies any numbness or tingling in his hands or feet.  Denies any shortness of breath, dyspnea  with exertion, swelling in the lower extremities.  No change in appetite or bowel habits.  Denies any fevers, chills, night sweats, or weight loss.  No new skeletal complaints.  Oncological/hematological History: **Amyloidosis of colon/MGUS: --Labs, 01/25/17: tProt 6.8, Alb 4.7, Ca 9.9, Cr 1.0, AP 43; WBC 6.0, Hgb 15.9, Plt 223 --Screening Colonoscopy (Zachary [name]), 10/23/17: 2-3 cm moderately erythematous friable and edematous area in the distal rectum.  Small internal hemorrhoids, diverticulosis. Pathology --globular/linear deposit of homogeneous material staining positive on Congo red and trichrome, consistent with amyloid deposition. --Labs, 11/30/17: SPEP -- "abnormal protein" 0.4g/dL; WBC 5.1, Hgb 15.3, Plt 214 --Labs, 12/24/17: tProt 7.6, Alb 4.6, Ca 10.3, Cr 1.0, AP 49, LDH 158, beta-2 microglobulin 1.3; SPEP -- MSpike 0.4g/dL, SIFE -- IgG lambda; IgG 786, IgA 100, IgM 80; kappa 5.9, lambda 8.2, KLR 0.72; WBC 5.9, Hgb 15.8, Plt 218; UPEP -- no MSpike; --ECHO, 12/26/17: LVEF wnl  No evidence of amyloid deposition   No history exists.    Medical History: Past Medical History:  Diagnosis Date  . Allergic rhinitis   . Asthma    not current-no treatment  . Hyperlipemia   . Sleep apnea    CPAP    Surgical History: Past Surgical History:  Procedure Laterality Date  . COLONOSCOPY    . POLYPECTOMY    . TONSILLECTOMY    . VASECTOMY      Family History: Family History  Problem Relation Age of Onset  . Colon cancer Neg Hx     Social History: Social History   Socioeconomic History  . Marital status: Married    Spouse name: Zachary [name]  . Number of children: 4  . Years of education: College  . Highest education level: Not on file  Occupational History  . Occupation: Passenger transport manager: [name]  Social Needs  . Financial resource strain: Not on file  . Food insecurity:    Worry: Not on file    Inability: Not on file  . Transportation  needs:    Medical: Not on file    Non-medical: Not on file  Tobacco Use  . Smoking status: Never Smoker  . Smokeless tobacco: Never Used  Substance and Sexual Activity  . Alcohol use: Yes    Alcohol/week: 2.4 oz    Types: 4 Glasses of wine per week    Comment: weekly -beer & wine   . Drug use: No  . Sexual activity: Yes    Partners: Female  Lifestyle  . Physical activity:    Days per week: Not on file    Minutes per session: Not on file  . Stress: Not on file  Relationships  . Social connections:    Talks on phone: Not on file    Gets together: Not on file    Attends religious service: Not on file    Active member of club or organization: Not on file  Attends meetings of clubs or organizations: Not on file    Relationship status: Not on file  . Intimate partner violence:    Fear of current or ex partner: Not on file    Emotionally abused: Not on file    Physically abused: Not on file    Forced sexual activity: Not on file  Other Topics Concern  . 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  2 sons- eldest in business with him;  2 daughters- eldest in fashion, younger interested in veteranarian medicine.  Marriage- in good health.  Riding "murdur" cycle but less than before.      Drinks 20oz of caffeine a day     Allergies: No Known Allergies  Medications:  Current Outpatient Medications  Medication Sig Dispense Refill  . atorvastatin (LIPITOR) 20 MG tablet Take 1 tablet (20 mg total) by mouth daily. 90 tablet 3  . MULTIPLE VITAMIN PO Take by mouth daily.      . Omega-3 Fatty Acids (FISH OIL) 1000 MG CAPS Take by mouth daily.      Marland Kitchen PAXIL CR 12.5 MG 24 hr tablet Take 1 tablet (12.5 mg total) by mouth every morning. Needs office [name] for more refills 90 tablet 3  . sildenafil (VIAGRA) 100 MG tablet Take 1 tablet (100 mg total) by mouth daily as needed. 10 tablet 5  . TURMERIC PO Take 1 tablet by mouth daily.     No current  facility-administered medications for this [name].     Review of Systems: Review of Systems  All other systems reviewed and are negative.    PHYSICAL EXAMINATION Blood pressure (!) 158/91, pulse 78, temperature 97.9 F (36.6 C), temperature source Oral, resp. rate 20, height '6\' 10"'  (2.083 m), weight 243 lb 8 oz (110.5 kg), SpO2 98 %.  ECOG PERFORMANCE STATUS: 0 - Asymptomatic  Physical Exam  Constitutional: He is oriented to person, place, and time and well-developed, well-nourished, and in no distress. No distress.  HENT:  Head: Normocephalic and atraumatic.  Mouth/Throat: Oropharynx is clear and moist. No oropharyngeal exudate.  Eyes: Pupils are equal, round, and reactive to light. Conjunctivae and EOM are normal. No scleral icterus.  Neck: No thyromegaly present.  Cardiovascular: Normal rate, regular rhythm and intact distal pulses.  No murmur heard. Pulmonary/Chest: Effort normal and breath sounds normal. No respiratory distress. He has no wheezes. He has no rales.  Abdominal: Soft. Bowel sounds are normal. He exhibits no distension and no mass. There is no tenderness. There is no guarding.  Musculoskeletal: He exhibits no edema.  Lymphadenopathy:    He has no cervical adenopathy.  Neurological: He is alert and oriented to person, place, and time. He has normal reflexes. No cranial nerve deficit.  Skin: Skin is warm and dry. No rash noted. He is not diaphoretic. No erythema. No pallor.     LABORATORY DATA: I have personally reviewed the data as listed: No visits with results within 1 Week(s) from this [name].  Latest known [name] with results is:  Clinical Support on 12/24/2017  Component Date Value Ref Range Status  . WBC Count 12/24/2017 5.9  4.0 - 10.3 K/uL Final  . RBC 12/24/2017 5.25  4.20 - 5.82 MIL/uL Final  . Hemoglobin 12/24/2017 15.8  13.0 - 17.1 g/dL Final  . HCT 12/24/2017 46.4  38.4 - 49.9 % Final  . MCV 12/24/2017 88.4  79.3 - 98.0 fL Final  . MCH 12/24/2017  30.0  27.2 - 33.4 pg Final  . MCHC 12/24/2017 33.9  32.0 - 36.0 g/dL Final  . RDW 12/24/2017 13.2  11.0 - 14.6 % Final  . Platelet Count 12/24/2017 218  140 - 400 K/uL Final  . Neutrophils Relative % 12/24/2017 67  % Final  . Neutro Abs 12/24/2017 4.0  1.5 - 6.5 K/uL Final  . Lymphocytes Relative 12/24/2017 21  % Final  . Lymphs Abs 12/24/2017 1.2  0.9 - 3.3 K/uL Final  . Monocytes Relative 12/24/2017 8  % Final  . Monocytes Absolute 12/24/2017 0.5  0.1 - 0.9 K/uL Final  . Eosinophils Relative 12/24/2017 3  % Final  . Eosinophils Absolute 12/24/2017 0.2  0.0 - 0.5 K/uL Final  . Basophils Relative 12/24/2017 1  % Final  . Basophils Absolute 12/24/2017 0.0  0.0 - 0.1 K/uL Final   Performed at [facility], [place] [address] [place], [place] [address]  . Sodium 12/24/2017 142  136 - 145 mmol/L Final  . Potassium 12/24/2017 4.1  3.5 - 5.1 mmol/L Final  . Chloride 12/24/2017 105  98 - 109 mmol/L Final  . CO2 12/24/2017 28  22 - 29 mmol/L Final  . Glucose, Bld 12/24/2017 95  70 - 140 mg/dL Final  . BUN 12/24/2017 13  7 - 26 mg/dL Final  . Creatinine 12/24/2017 1.01  0.70 - 1.30 mg/dL Final  . Calcium 12/24/2017 10.3  8.4 - 10.4 mg/dL Final  . Total Protein 12/24/2017 7.6  6.4 - 8.3 g/dL Final  . Albumin 12/24/2017 4.6  3.5 - 5.0 g/dL Final  . AST 12/24/2017 19  5 - 34 U/L Final  . ALT 12/24/2017 30  0 - 55 U/L Final  . Alkaline Phosphatase 12/24/2017 49  40 - 150 U/L Final  . Total Bilirubin 12/24/2017 0.9  0.2 - 1.2 mg/dL Final  . GFR, Est Non Af Am 12/24/2017 >60  >60 mL/min Final  . GFR, Est AFR Am 12/24/2017 >60  >60 mL/min Final   Comment: (NOTE) The eGFR has been calculated using the CKD EPI equation. This calculation has not been validated in all clinical situations. eGFR's persistently <60 mL/min signify possible Chronic Kidney Disease.   Georgiann Hahn gap 12/24/2017 9  3 - 11 Final   Performed at [facility], [place] [address],  [place], [place] [address]  . LDH 12/24/2017 158  125 - 245 U/L Final   Performed at [facility], [place] [address], [place], [place] [address]  . TSH 12/24/2017 0.495  0.320 - 4.118 uIU/mL Final   Performed at [facility], [place] [address] [place], [place] [address]  . Total Protein ELP 12/24/2017 7.1  6.0 - 8.5 g/dL Final  . Albumin ELP 12/24/2017 4.3  2.9 - 4.4 g/dL Final  . Alpha-1-Globulin 12/24/2017 0.2  0.0 - 0.4 g/dL Final  . Alpha-2-Globulin 12/24/2017 0.7  0.4 - 1.0 g/dL Final  . Beta Globulin 12/24/2017 1.1  0.7 - 1.3 g/dL Final  . Gamma Globulin 12/24/2017 0.9  0.4 - 1.8 g/dL Final  . M-Spike, % 12/24/2017 0.4* Not Observed g/dL Final  . GLOBULIN, TOTAL 12/24/2017 2.8  2.2 - 3.9 g/dL Corrected  . A/G Ratio 12/24/2017 1.5  0.7 - 1.7 Corrected  . Comment 12/24/2017 Comment   Corrected   Comment: (NOTE) Protein electrophoresis scan will follow via computer, mail, or courier delivery.   Marland Kitchen SPEP Interpretation 12/24/2017 .   Final   Comment: (  NOTE) Performed At: [facility] [place], [place] [id number] Rush Farmer MD [id number] Performed at [facility], [place] [address], [place], [place] [address]   . IgG (Immunoglobin G), Serum 12/24/2017 786  700 - 1,600 mg/dL Final  . IgA 12/24/2017 100  90 - 386 mg/dL Final  . IgM (Immunoglobulin M), Srm 12/24/2017 80  20 - 172 mg/dL Final  . IFE 1 12/24/2017 Comment   Final   Comment: (NOTE) Immunofixation shows IgG monoclonal protein with lambda light chain specificity. Performed At: [facility] [place], [place] [id number] Rush Farmer MD SU:[id number] Performed at [facility], [place] [address], [place], [place] [address]   . Total Protein, Urine 12/28/2017 6.0  Not Estab. mg/dL Final  . Total Protein, Urine-Ur/day 12/28/2017 168* 30 - 150 mg/24 hr Final  . Albumin, U 12/28/2017 100.0  %  Final  . ALPHA 1 URINE 12/28/2017 0.0  % Final  . Alpha 2, Urine 12/28/2017 0.0  % Final  . % BETA, Urine 12/28/2017 0.0  % Final  . GAMMA GLOBULIN URINE 12/28/2017 0.0  % Final  . Free Kappa Lt Chains,Ur 12/28/2017 4.91  1.35 - 24.19 mg/L Final   **Results verified by repeat testing**  . Free Lambda Lt Chains,Ur 12/28/2017 0.48  0.24 - 6.66 mg/L Corrected   **Results verified by repeat testing**  . Free Kappa/Lambda Ratio 12/28/2017 10.23  2.04 - 10.37 Corrected   Comment: (NOTE) Performed At: [facility] [place], [place] [id number] Rush Farmer MD [id number]   . Immunofixation Result, Urine 12/28/2017 Comment   Corrected   An apparent normal immunofixation pattern.  . Total Volume 12/28/2017 2,800   Final  . M-SPIKE %, Urine 12/28/2017 Not Observed  Not Observed % Corrected  . Note: 12/28/2017 Comment   Corrected   Comment: (NOTE) Protein electrophoresis scan will follow via computer, mail, or courier delivery. Performed at [facility], [place] [address] [place],  [address]        Ardath Sax, MD

## 2018-01-09 ENCOUNTER — Telehealth: Payer: Self-pay

## 2018-01-09 DIAGNOSIS — H903 Sensorineural hearing loss, bilateral: Secondary | ICD-10-CM | POA: Diagnosis not present

## 2018-01-09 NOTE — Telephone Encounter (Signed)
Returned call to patient in regards to my chart message. Patient has CT and Bone Marrow Biopsy scheduled. Patient will call one day prior to appointment with Dr. Lebron Conners to verify if Bone Marrow Biopsy results are completed. If not, appointment will be moved back one week.

## 2018-01-11 ENCOUNTER — Ambulatory Visit (HOSPITAL_COMMUNITY)
Admission: RE | Admit: 2018-01-11 | Discharge: 2018-01-11 | Disposition: A | Discharge status: Home / Self Care | Payer: BLUE CROSS/BLUE SHIELD | Source: Ambulatory Visit | Attending: Hematology and Oncology | Admitting: Hematology and Oncology

## 2018-01-11 DIAGNOSIS — C9000 Multiple myeloma not having achieved remission: Secondary | ICD-10-CM | POA: Diagnosis not present

## 2018-01-11 DIAGNOSIS — E859 Amyloidosis, unspecified: Secondary | ICD-10-CM | POA: Insufficient documentation

## 2018-01-11 DIAGNOSIS — S2242XA Multiple fractures of ribs, left side, initial encounter for closed fracture: Secondary | ICD-10-CM | POA: Insufficient documentation

## 2018-01-11 DIAGNOSIS — D472 Monoclonal gammopathy: Secondary | ICD-10-CM | POA: Diagnosis not present

## 2018-01-11 LAB — GLUCOSE, CAPILLARY: Glucose-Capillary: 100 mg/dL — ABNORMAL HIGH (ref 65–99)

## 2018-01-11 MED ORDER — FLUDEOXYGLUCOSE F - 18 (FDG) INJECTION
12.1000 | Freq: Once | INTRAVENOUS | Status: AC | PRN
  Administered 2018-01-11: 12.1 via INTRAVENOUS

## 2018-01-20 ENCOUNTER — Other Ambulatory Visit: Payer: Self-pay | Admitting: Radiology

## 2018-01-21 ENCOUNTER — Telehealth: Payer: Self-pay | Admitting: *Deleted

## 2018-01-21 ENCOUNTER — Other Ambulatory Visit: Payer: Self-pay | Admitting: Radiology

## 2018-01-21 NOTE — Telephone Encounter (Signed)
Patient called stating should he continue with the bone marrow biopsy scheduled for tomorrow, although PET scan was negative for myeloma. Per Lebron Conners, MD patient should continue with bone marrow biopsy tomorrow, patient verbalized understanding.

## 2018-01-22 ENCOUNTER — Encounter (HOSPITAL_COMMUNITY): Payer: Self-pay

## 2018-01-22 ENCOUNTER — Ambulatory Visit (HOSPITAL_COMMUNITY)
Admission: RE | Admit: 2018-01-22 | Discharge: 2018-01-22 | Disposition: A | Discharge status: Home / Self Care | Payer: BLUE CROSS/BLUE SHIELD | Source: Ambulatory Visit | Attending: Hematology and Oncology | Admitting: Hematology and Oncology

## 2018-01-22 DIAGNOSIS — D472 Monoclonal gammopathy: Secondary | ICD-10-CM

## 2018-01-22 DIAGNOSIS — E859 Amyloidosis, unspecified: Secondary | ICD-10-CM | POA: Insufficient documentation

## 2018-01-22 DIAGNOSIS — D72822 Plasmacytosis: Secondary | ICD-10-CM | POA: Diagnosis not present

## 2018-01-22 HISTORY — DX: Fracture of one rib, unspecified side, initial encounter for closed fracture: S22.39XA

## 2018-01-22 HISTORY — DX: Multiple fractures of ribs, unspecified side, initial encounter for closed fracture: S22.49XA

## 2018-01-22 LAB — PROTIME-INR
INR: 0.94
Prothrombin Time: 12.5 seconds (ref 11.4–15.2)

## 2018-01-22 LAB — CBC WITH DIFFERENTIAL/PLATELET
BASOS ABS: 0 10*3/uL (ref 0.0–0.1)
Basophils Relative: 0 %
Eosinophils Absolute: 0.2 10*3/uL (ref 0.0–0.7)
Eosinophils Relative: 4 %
HEMATOCRIT: 42.1 % (ref 39.0–52.0)
Hemoglobin: 14.4 g/dL (ref 13.0–17.0)
LYMPHS ABS: 1.1 10*3/uL (ref 0.7–4.0)
LYMPHS PCT: 22 %
MCH: 30.3 pg (ref 26.0–34.0)
MCHC: 34.2 g/dL (ref 30.0–36.0)
MCV: 88.6 fL (ref 78.0–100.0)
MONO ABS: 0.5 10*3/uL (ref 0.1–1.0)
Monocytes Relative: 9 %
NEUTROS ABS: 3.2 10*3/uL (ref 1.7–7.7)
Neutrophils Relative %: 65 %
Platelets: 237 10*3/uL (ref 150–400)
RBC: 4.75 MIL/uL (ref 4.22–5.81)
RDW: 12.9 % (ref 11.5–15.5)
WBC: 4.9 10*3/uL (ref 4.0–10.5)

## 2018-01-22 LAB — BASIC METABOLIC PANEL
Anion gap: 7 (ref 5–15)
BUN: 17 mg/dL (ref 6–20)
CHLORIDE: 111 mmol/L (ref 101–111)
CO2: 25 mmol/L (ref 22–32)
CREATININE: 0.9 mg/dL (ref 0.61–1.24)
Calcium: 9 mg/dL (ref 8.9–10.3)
GFR calc Af Amer: >60 mL/min (ref >60)
GFR calc non Af Amer: >60 mL/min (ref >60)
GLUCOSE: 106 mg/dL — AB (ref 65–99)
Potassium: 4.3 mmol/L (ref 3.5–5.1)
Sodium: 143 mmol/L (ref 135–145)

## 2018-01-22 MED ORDER — HYDROCODONE-ACETAMINOPHEN 5-325 MG PO TABS: 1.0000 | ORAL_TABLET | ORAL | Status: DC | PRN

## 2018-01-22 MED ORDER — LIDOCAINE HCL 1 % IJ SOLN
INTRAMUSCULAR | Status: AC | PRN
  Administered 2018-01-22: 10 mL via INTRADERMAL

## 2018-01-22 MED ORDER — FENTANYL CITRATE (PF) 100 MCG/2ML IJ SOLN
INTRAMUSCULAR | Status: AC | PRN
  Administered 2018-01-22 (×2): 50 ug via INTRAVENOUS

## 2018-01-22 MED ORDER — FENTANYL CITRATE (PF) 100 MCG/2ML IJ SOLN
INTRAMUSCULAR | Status: AC
  Filled 2018-01-22: qty 4

## 2018-01-22 MED ORDER — SODIUM CHLORIDE 0.9 % IV SOLN
INTRAVENOUS | Status: DC
  Administered 2018-01-22: 08:00:00 via INTRAVENOUS

## 2018-01-22 MED ORDER — MIDAZOLAM HCL 2 MG/2ML IJ SOLN
INTRAMUSCULAR | Status: AC | PRN
  Administered 2018-01-22 (×2): 1 mg via INTRAVENOUS

## 2018-01-22 MED ORDER — MIDAZOLAM HCL 2 MG/2ML IJ SOLN
INTRAMUSCULAR | Status: AC
  Filled 2018-01-22: qty 4

## 2018-01-22 NOTE — Procedures (Signed)
CT guided bone marrow biopsy.  2 aspirates and 1 core.  Minimal blood loss and no immediate complication.   
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
## 2018-01-22 NOTE — Discharge Instructions (Signed)

## 2018-01-23 ENCOUNTER — Ambulatory Visit: Payer: BLUE CROSS/BLUE SHIELD | Admitting: Family

## 2018-01-23 ENCOUNTER — Ambulatory Visit: Payer: BLUE CROSS/BLUE SHIELD | Admitting: Nurse Practitioner

## 2018-01-23 ENCOUNTER — Encounter: Payer: Self-pay | Admitting: Family

## 2018-01-23 VITALS — BP 124/82 | HR 67 | Temp 97.9°F | Ht >= 80 in | Wt 245.0 lb

## 2018-01-23 DIAGNOSIS — E782 Mixed hyperlipidemia: Secondary | ICD-10-CM | POA: Diagnosis not present

## 2018-01-23 DIAGNOSIS — F411 Generalized anxiety disorder: Secondary | ICD-10-CM | POA: Diagnosis not present
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
## 2018-01-24 ENCOUNTER — Ambulatory Visit: Payer: BLUE CROSS/BLUE SHIELD | Admitting: Neurology

## 2018-01-28 ENCOUNTER — Telehealth: Payer: Self-pay | Admitting: Hematology and Oncology

## 2018-01-28 ENCOUNTER — Encounter: Payer: Self-pay | Admitting: Hematology and Oncology

## 2018-01-28 ENCOUNTER — Inpatient Hospital Stay: Payer: BLUE CROSS/BLUE SHIELD | Attending: Hematology and Oncology | Admitting: Hematology and Oncology

## 2018-01-28 VITALS — BP 127/92 | HR 81 | Temp 97.6°F | Resp 18 | Ht >= 80 in | Wt 239.2 lb

## 2018-01-28 DIAGNOSIS — Z79899 Other long term (current) drug therapy: Secondary | ICD-10-CM | POA: Diagnosis not present

## 2018-01-28 DIAGNOSIS — C9000 Multiple myeloma not having achieved remission: Secondary | ICD-10-CM

## 2018-01-28 DIAGNOSIS — E859 Amyloidosis, unspecified: Secondary | ICD-10-CM

## 2018-01-28 NOTE — Telephone Encounter (Signed)
Scheduled appt per 4/22 los - sent reminder letter in the mail - lab and f/ in 3 months.
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
## 2018-01-28 NOTE — Assessment & Plan Note (Signed)
62 y.o. male with incidental discovery of amyloid deposition in the large bowel during a routine screening colonoscopy.  Additional lab work obtained after our last visit in the clinic demonstrated presence of IgG lambda monoclonal gammopathy with no significant abnormalities in calcium, creatinine, or hematological profile.  Subsequently, we have obtain PET/CT which revealed no radiographic findings to suggest multiple myeloma, nevertheless the bone marrow biopsy demonstrated a 30% replacement of the bone marrow cellularity with plasma cells without expression of Kappa lambda light chains evident on immunohistochemistry.  Cytogenetics and molecular studies are currently pending.  Patient is not needing any of the KRAB criteria, and even he is amyloid depositions appear to be asymptomatic at this time.  Patient has no history of myocardial amyloidosis based on most recent echocardiogram.  Overall, I do not find evidence to suggest presence of active multiple myeloma and thus no immediate evidence to initiate therapy.  I believe observation is adequate as long as patient remains asymptomatic with stable laboratory values although the low production of the medical protein by his disease leaves the simple laboratory workup of potential limited value as the monitoring tool.  Plan: -Initiate observation -Second opinion regarding treatment initiation from [facility]ology -Return to our clinic in 3 months with labs obtained 1 week prior.

## 2018-02-01 ENCOUNTER — Encounter (HOSPITAL_COMMUNITY): Payer: Self-pay | Admitting: Hematology and Oncology

## 2018-02-04 LAB — TISSUE HYBRIDIZATION (BONE MARROW)-NCBH

## 2018-02-04 LAB — CHROMOSOME ANALYSIS, BONE MARROW

## 2018-02-08 ENCOUNTER — Other Ambulatory Visit: Payer: Self-pay | Admitting: Family

## 2018-02-08 MED ORDER — SILDENAFIL CITRATE 100 MG PO TABS: 100.0000 mg | ORAL_TABLET | Freq: Every day | ORAL | 5 refills | Status: DC | PRN

## 2018-02-13 ENCOUNTER — Telehealth: Payer: Self-pay | Admitting: Hematology

## 2018-02-13 NOTE — Telephone Encounter (Signed)
Appointment moved to Dr Irene Limbo schedule per 5/2 sch msg

## 2018-03-01 ENCOUNTER — Telehealth: Payer: Self-pay | Admitting: Hematology and Oncology

## 2018-03-01 NOTE — Telephone Encounter (Signed)
Called pt re appts being moved due to MP leaving - left vm for pt re appts

## 2018-03-11 ENCOUNTER — Encounter: Payer: Self-pay | Admitting: Neurology

## 2018-03-11 ENCOUNTER — Ambulatory Visit: Payer: BLUE CROSS/BLUE SHIELD | Admitting: Neurology

## 2018-03-11 VITALS — BP 122/77 | HR 63 | Ht >= 80 in | Wt 237.0 lb

## 2018-03-11 DIAGNOSIS — Z9989 Dependence on other enabling machines and devices: Secondary | ICD-10-CM | POA: Diagnosis not present

## 2018-03-11 DIAGNOSIS — G4733 Obstructive sleep apnea (adult) (pediatric): Secondary | ICD-10-CM

## 2018-03-11 NOTE — Patient Instructions (Addendum)
Please continue using your autoPAP regularly. While your insurance requires that you use PAP at least 4 hours each night on 70% of the nights, I recommend, that you not skip any nights and use it throughout the night if you can. Getting used to PAP and staying with the treatment long term does take time and patience and discipline. Untreated obstructive sleep apnea when it is moderate to severe can have an adverse impact on cardiovascular health and raise her risk for heart disease, arrhythmias, hypertension, congestive heart failure, stroke and diabetes. Untreated obstructive sleep apnea causes sleep disruption, nonrestorative sleep, and sleep deprivation. This can have an impact on your day to day functioning and cause daytime sleepiness and impairment of cognitive function, memory loss, mood disturbance, and problems focussing. Using PAP regularly can improve these symptoms.  Please try to be more consistent with your autoPAP. We will request a mask refit appt thorough [facility].   The address for [facility] is: [address] [phone].   We can see you in 1 year, you can see Ward Givens, nurse practitioner again next time.   We can consider a dental device for sleep apnea treatment too if needed. Talk to your dentist about it too, see what they say.
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
## 2018-04-17 ENCOUNTER — Inpatient Hospital Stay: Payer: BLUE CROSS/BLUE SHIELD | Attending: Hematology and Oncology

## 2018-04-17 DIAGNOSIS — C9000 Multiple myeloma not having achieved remission: Secondary | ICD-10-CM | POA: Diagnosis not present

## 2018-04-17 DIAGNOSIS — E859 Amyloidosis, unspecified: Secondary | ICD-10-CM | POA: Diagnosis not present

## 2018-04-17 DIAGNOSIS — G4730 Sleep apnea, unspecified: Secondary | ICD-10-CM | POA: Diagnosis not present

## 2018-04-17 DIAGNOSIS — K5730 Diverticulosis of large intestine without perforation or abscess without bleeding: Secondary | ICD-10-CM | POA: Diagnosis not present

## 2018-04-17 DIAGNOSIS — J45909 Unspecified asthma, uncomplicated: Secondary | ICD-10-CM | POA: Diagnosis not present

## 2018-04-17 DIAGNOSIS — Z79899 Other long term (current) drug therapy: Secondary | ICD-10-CM | POA: Diagnosis not present

## 2018-04-17 DIAGNOSIS — K648 Other hemorrhoids: Secondary | ICD-10-CM | POA: Insufficient documentation

## 2018-04-17 DIAGNOSIS — E785 Hyperlipidemia, unspecified: Secondary | ICD-10-CM | POA: Diagnosis not present

## 2018-04-17 LAB — CBC WITH DIFFERENTIAL (CANCER CENTER ONLY)
Basophils Absolute: 0 10*3/uL (ref 0.0–0.1)
Basophils Relative: 0 %
EOS ABS: 0.1 10*3/uL (ref 0.0–0.5)
EOS PCT: 2 %
HCT: 44.3 % (ref 38.4–49.9)
Hemoglobin: 15.3 g/dL (ref 13.0–17.1)
LYMPHS ABS: 1.2 10*3/uL (ref 0.9–3.3)
LYMPHS PCT: 23 %
MCH: 31.2 pg (ref 27.2–33.4)
MCHC: 34.5 g/dL (ref 32.0–36.0)
MCV: 90.2 fL (ref 79.3–98.0)
MONO ABS: 0.5 10*3/uL (ref 0.1–0.9)
Monocytes Relative: 9 %
Neutro Abs: 3.5 10*3/uL (ref 1.5–6.5)
Neutrophils Relative %: 66 %
PLATELETS: 210 10*3/uL (ref 140–400)
RBC: 4.91 MIL/uL (ref 4.20–5.82)
RDW: 13 % (ref 11.0–14.6)
WBC: 5.3 10*3/uL (ref 4.0–10.3)

## 2018-04-17 LAB — CMP (CANCER CENTER ONLY)
ALK PHOS: 56 U/L (ref 38–126)
ALT: 32 U/L (ref 0–44)
AST: 21 U/L (ref 15–41)
Albumin: 4.7 g/dL (ref 3.5–5.0)
Anion gap: 7 (ref 5–15)
BILIRUBIN TOTAL: 0.8 mg/dL (ref 0.3–1.2)
BUN: 13 mg/dL (ref 6–20)
CALCIUM: 10 mg/dL (ref 8.9–10.3)
CO2: 27 mmol/L (ref 22–32)
CREATININE: 0.98 mg/dL (ref 0.61–1.24)
Chloride: 106 mmol/L (ref 98–111)
GFR, Est AFR Am: >60 mL/min (ref >60)
GFR, Estimated: >60 mL/min (ref >60)
GLUCOSE: 94 mg/dL (ref 70–99)
Potassium: 4.2 mmol/L (ref 3.5–5.1)
SODIUM: 140 mmol/L (ref 135–145)
TOTAL PROTEIN: 7.2 g/dL (ref 6.5–8.1)

## 2018-04-17 LAB — LACTATE DEHYDROGENASE: LDH: 156 U/L (ref 98–192)

## 2018-04-18 LAB — KAPPA/LAMBDA LIGHT CHAINS
KAPPA FREE LGHT CHN: 5.8 mg/L (ref 3.3–19.4)
KAPPA, LAMDA LIGHT CHAIN RATIO: 0.78 (ref 0.26–1.65)
LAMDA FREE LIGHT CHAINS: 7.4 mg/L (ref 5.7–26.3)

## 2018-04-18 LAB — BETA 2 MICROGLOBULIN, SERUM: Beta-2 Microglobulin: 1.4 mg/L (ref 0.6–2.4)

## 2018-04-19 LAB — MULTIPLE MYELOMA PANEL, SERUM
ALBUMIN/GLOB SERPL: 1.8 — AB (ref 0.7–1.7)
ALPHA2 GLOB SERPL ELPH-MCNC: 0.6 g/dL (ref 0.4–1.0)
Albumin SerPl Elph-Mcnc: 4.4 g/dL (ref 2.9–4.4)
Alpha 1: 0.2 g/dL (ref 0.0–0.4)
B-Globulin SerPl Elph-Mcnc: 0.9 g/dL (ref 0.7–1.3)
Gamma Glob SerPl Elph-Mcnc: 0.7 g/dL (ref 0.4–1.8)
Globulin, Total: 2.5 g/dL (ref 2.2–3.9)
IGA: 91 mg/dL (ref 90–386)
IGG (IMMUNOGLOBIN G), SERUM: 752 mg/dL (ref 700–1600)
IGM (IMMUNOGLOBULIN M), SRM: 74 mg/dL (ref 20–172)
M Protein SerPl Elph-Mcnc: 0.4 g/dL — ABNORMAL HIGH
Total Protein ELP: 6.9 g/dL (ref 6.0–8.5)

## 2018-04-23 ENCOUNTER — Other Ambulatory Visit: Payer: BLUE CROSS/BLUE SHIELD

## 2018-04-30 ENCOUNTER — Ambulatory Visit: Payer: BLUE CROSS/BLUE SHIELD | Admitting: Hematology and Oncology

## 2018-04-30 ENCOUNTER — Inpatient Hospital Stay (HOSPITAL_BASED_OUTPATIENT_CLINIC_OR_DEPARTMENT_OTHER): Payer: BLUE CROSS/BLUE SHIELD | Admitting: Hematology

## 2018-04-30 ENCOUNTER — Telehealth: Payer: Self-pay | Admitting: Hematology

## 2018-04-30 VITALS — BP 136/75 | HR 68 | Temp 98.1°F | Resp 18 | Ht >= 80 in | Wt 241.9 lb

## 2018-04-30 DIAGNOSIS — G4730 Sleep apnea, unspecified: Secondary | ICD-10-CM | POA: Diagnosis not present

## 2018-04-30 DIAGNOSIS — K648 Other hemorrhoids: Secondary | ICD-10-CM | POA: Diagnosis not present

## 2018-04-30 DIAGNOSIS — Z79899 Other long term (current) drug therapy: Secondary | ICD-10-CM

## 2018-04-30 DIAGNOSIS — E859 Amyloidosis, unspecified: Secondary | ICD-10-CM | POA: Diagnosis not present

## 2018-04-30 DIAGNOSIS — J45909 Unspecified asthma, uncomplicated: Secondary | ICD-10-CM | POA: Diagnosis not present

## 2018-04-30 DIAGNOSIS — C9000 Multiple myeloma not having achieved remission: Secondary | ICD-10-CM

## 2018-04-30 DIAGNOSIS — E785 Hyperlipidemia, unspecified: Secondary | ICD-10-CM | POA: Diagnosis not present

## 2018-04-30 DIAGNOSIS — K5730 Diverticulosis of large intestine without perforation or abscess without bleeding: Secondary | ICD-10-CM | POA: Diagnosis not present

## 2018-04-30 DIAGNOSIS — D472 Monoclonal gammopathy: Secondary | ICD-10-CM
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
## 2018-04-30 NOTE — Telephone Encounter (Signed)
Gave patient avs and calendar of upcoming nov appts.  °

## 2018-05-01 LAB — UPEP/UIFE/LIGHT CHAINS/TP, 24-HR UR
% BETA, URINE: 0 %
ALBUMIN, U: 100 %
ALPHA 1 URINE: 0 %
Alpha 2, Urine: 0 %
Free Kappa Lt Chains,Ur: 5.19 mg/L (ref 1.35–24.19)
Free Kappa/Lambda Ratio: 10.81 — ABNORMAL HIGH (ref 2.04–10.37)
Free Lambda Lt Chains,Ur: 0.48 mg/L (ref 0.24–6.66)
GAMMA GLOBULIN URINE: 0 %
TOTAL PROTEIN, URINE-UPE24: 6.2 mg/dL
TOTAL PROTEIN, URINE-UR/DAY: 155 mg/(24.h) — AB (ref 30–150)
Total Volume: 2500

## 2018-05-15 DIAGNOSIS — H524 Presbyopia: Secondary | ICD-10-CM | POA: Diagnosis not present

## 2018-05-15 DIAGNOSIS — H52223 Regular astigmatism, bilateral: Secondary | ICD-10-CM | POA: Diagnosis not present

## 2018-05-20 ENCOUNTER — Encounter (HOSPITAL_COMMUNITY): Payer: Self-pay

## 2018-06-05 ENCOUNTER — Telehealth: Payer: Self-pay

## 2018-06-05 NOTE — Telephone Encounter (Signed)
Key: [id number]   Started today via cover my meds

## 2018-06-06 ENCOUNTER — Telehealth: Payer: Self-pay

## 2018-06-06 NOTE — Telephone Encounter (Signed)
Went on cover my meds today and no prior auth needed. Request was cancelled out on cover my meds. (Message copied from website)  Outcome:  Cancelledon August 28  This medication does not require prior plan approval. Please be advised that benefit limits may apply. -sjackson

## 2018-08-01 ENCOUNTER — Other Ambulatory Visit: Payer: Self-pay | Admitting: Family

## 2018-08-01 ENCOUNTER — Encounter: Payer: Self-pay | Admitting: Family

## 2018-08-01 NOTE — Telephone Encounter (Signed)
Noted a MyChart message from 12:01 today that indicated the preferred pharmacy for refill on Sildenafil is [facility], but telephone encounter indicated [facility] on [address] and [address].  Left telephone message for pt. To call and confirm the pharmacy preference.

## 2018-08-01 NOTE — Telephone Encounter (Signed)
Copied from [place] [id number]. Topic: Quick Communication - Rx Refill/Question >> Aug 01, 2018 12:38 PM Keene Breath wrote: Medication: sildenafil (VIAGRA) 100 MG tablet  Patient called to request a refill for the above medication.  CB# [id number]  Preferred Pharmacy (with phone number or street name): [facility] [phone] - [place], [place] - [address] [phone] (Phone) [phone] (Fax)

## 2018-08-01 NOTE — Telephone Encounter (Signed)
Patient called, no answer on home phone listed. Left VM on cell phone to call back to give clarification on where to send in the requested medication Sildenafil, [facility] or [facility].

## 2018-08-02 ENCOUNTER — Other Ambulatory Visit: Payer: Self-pay | Admitting: Family

## 2018-08-02 MED ORDER — SILDENAFIL CITRATE 100 MG PO TABS: 100.0000 mg | ORAL_TABLET | Freq: Every day | ORAL | 5 refills | Status: DC | PRN

## 2018-08-06 DIAGNOSIS — L570 Actinic keratosis: Secondary | ICD-10-CM | POA: Diagnosis not present

## 2018-08-06 DIAGNOSIS — D225 Melanocytic nevi of trunk: Secondary | ICD-10-CM | POA: Diagnosis not present

## 2018-08-06 DIAGNOSIS — Z23 Encounter for immunization: Secondary | ICD-10-CM | POA: Diagnosis not present

## 2018-08-06 DIAGNOSIS — L820 Inflamed seborrheic keratosis: Secondary | ICD-10-CM | POA: Diagnosis not present

## 2018-08-06 DIAGNOSIS — D485 Neoplasm of uncertain behavior of skin: Secondary | ICD-10-CM | POA: Diagnosis not present

## 2018-08-14 ENCOUNTER — Encounter: Payer: Self-pay | Admitting: Hematology

## 2018-08-20 ENCOUNTER — Inpatient Hospital Stay: Payer: BLUE CROSS/BLUE SHIELD | Attending: Hematology

## 2018-08-20 DIAGNOSIS — E8581 Light chain (AL) amyloidosis: Secondary | ICD-10-CM | POA: Diagnosis not present

## 2018-08-20 DIAGNOSIS — Z79899 Other long term (current) drug therapy: Secondary | ICD-10-CM | POA: Diagnosis not present

## 2018-08-20 DIAGNOSIS — D472 Monoclonal gammopathy: Secondary | ICD-10-CM

## 2018-08-20 DIAGNOSIS — C9000 Multiple myeloma not having achieved remission: Secondary | ICD-10-CM | POA: Insufficient documentation

## 2018-08-20 DIAGNOSIS — F419 Anxiety disorder, unspecified: Secondary | ICD-10-CM | POA: Insufficient documentation

## 2018-08-20 DIAGNOSIS — E859 Amyloidosis, unspecified: Secondary | ICD-10-CM

## 2018-08-20 LAB — CBC WITH DIFFERENTIAL/PLATELET
ABS IMMATURE GRANULOCYTES: 0.02 10*3/uL (ref 0.00–0.07)
BASOS ABS: 0 10*3/uL (ref 0.0–0.1)
BASOS PCT: 1 %
Eosinophils Absolute: 0.3 10*3/uL (ref 0.0–0.5)
Eosinophils Relative: 4 %
HEMATOCRIT: 43.8 % (ref 39.0–52.0)
Hemoglobin: 14.9 g/dL (ref 13.0–17.0)
IMMATURE GRANULOCYTES: 0 %
LYMPHS ABS: 1.3 10*3/uL (ref 0.7–4.0)
Lymphocytes Relative: 21 %
MCH: 29.9 pg (ref 26.0–34.0)
MCHC: 34 g/dL (ref 30.0–36.0)
MCV: 88 fL (ref 80.0–100.0)
MONOS PCT: 8 %
Monocytes Absolute: 0.5 10*3/uL (ref 0.1–1.0)
NEUTROS ABS: 4.1 10*3/uL (ref 1.7–7.7)
NEUTROS PCT: 66 %
NRBC: 0 % (ref 0.0–0.2)
PLATELETS: 237 10*3/uL (ref 150–400)
RBC: 4.98 MIL/uL (ref 4.22–5.81)
RDW: 12.3 % (ref 11.5–15.5)
WBC: 6.2 10*3/uL (ref 4.0–10.5)

## 2018-08-20 LAB — CMP (CANCER CENTER ONLY)
ALK PHOS: 51 U/L (ref 38–126)
ALT: 31 U/L (ref 0–44)
AST: 20 U/L (ref 15–41)
Albumin: 4.5 g/dL (ref 3.5–5.0)
Anion gap: 9 (ref 5–15)
BILIRUBIN TOTAL: 0.7 mg/dL (ref 0.3–1.2)
BUN: 16 mg/dL (ref 8–23)
CALCIUM: 9.9 mg/dL (ref 8.9–10.3)
CO2: 27 mmol/L (ref 22–32)
CREATININE: 1.02 mg/dL (ref 0.61–1.24)
Chloride: 105 mmol/L (ref 98–111)
Glucose, Bld: 94 mg/dL (ref 70–99)
Potassium: 4.1 mmol/L (ref 3.5–5.1)
Sodium: 141 mmol/L (ref 135–145)
Total Protein: 7.5 g/dL (ref 6.5–8.1)

## 2018-08-20 LAB — LACTATE DEHYDROGENASE: LDH: 165 U/L (ref 98–192)

## 2018-08-20 LAB — SEDIMENTATION RATE: Sed Rate: 1 mm/hr (ref 0–16)

## 2018-08-21 LAB — KAPPA/LAMBDA LIGHT CHAINS
KAPPA FREE LGHT CHN: 6.1 mg/L (ref 3.3–19.4)
KAPPA, LAMDA LIGHT CHAIN RATIO: 0.69 (ref 0.26–1.65)
LAMDA FREE LIGHT CHAINS: 8.9 mg/L (ref 5.7–26.3)

## 2018-08-22 ENCOUNTER — Telehealth: Payer: Self-pay | Admitting: *Deleted

## 2018-08-23 LAB — MULTIPLE MYELOMA PANEL, SERUM
ALPHA 1: 0.2 g/dL (ref 0.0–0.4)
ALPHA2 GLOB SERPL ELPH-MCNC: 0.7 g/dL (ref 0.4–1.0)
Albumin SerPl Elph-Mcnc: 4.4 g/dL (ref 2.9–4.4)
Albumin/Glob SerPl: 1.7 (ref 0.7–1.7)
B-GLOBULIN SERPL ELPH-MCNC: 1.1 g/dL (ref 0.7–1.3)
Gamma Glob SerPl Elph-Mcnc: 0.7 g/dL (ref 0.4–1.8)
Globulin, Total: 2.7 g/dL (ref 2.2–3.9)
IGG (IMMUNOGLOBIN G), SERUM: 756 mg/dL (ref 700–1600)
IGM (IMMUNOGLOBULIN M), SRM: 84 mg/dL (ref 20–172)
IgA: 90 mg/dL (ref 61–437)
M Protein SerPl Elph-Mcnc: 0.3 g/dL — ABNORMAL HIGH
TOTAL PROTEIN ELP: 7.1 g/dL (ref 6.0–8.5)
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
## 2018-08-27 ENCOUNTER — Encounter: Payer: Self-pay | Admitting: Hematology

## 2018-08-27 ENCOUNTER — Inpatient Hospital Stay (HOSPITAL_BASED_OUTPATIENT_CLINIC_OR_DEPARTMENT_OTHER): Payer: BLUE CROSS/BLUE SHIELD | Admitting: Hematology

## 2018-08-27 ENCOUNTER — Telehealth: Payer: Self-pay | Admitting: Hematology

## 2018-08-27 VITALS — BP 142/98 | HR 71 | Temp 98.4°F | Resp 14 | Ht >= 80 in | Wt 245.0 lb

## 2018-08-27 DIAGNOSIS — F419 Anxiety disorder, unspecified: Secondary | ICD-10-CM

## 2018-08-27 DIAGNOSIS — C9000 Multiple myeloma not having achieved remission: Secondary | ICD-10-CM | POA: Diagnosis not present

## 2018-08-27 DIAGNOSIS — Z79899 Other long term (current) drug therapy: Secondary | ICD-10-CM | POA: Diagnosis not present

## 2018-08-27 DIAGNOSIS — E8581 Light chain (AL) amyloidosis: Secondary | ICD-10-CM | POA: Diagnosis not present

## 2018-08-27 NOTE — Telephone Encounter (Signed)
Pt declined avs and calendar  °

## 2018-08-27 NOTE — Patient Instructions (Signed)
Thank you for choosing [facility] to provide your oncology and hematology care.  To afford each patient quality time with our providers, please arrive 30 minutes before your scheduled appointment time.  If you arrive late for your appointment, you may be asked to reschedule.  We strive to give you quality time with our providers, and arriving late affects you and other patients whose appointments are after yours.    If you are a no show for multiple scheduled visits, you may be dismissed from the clinic at the providers discretion.     Again, thank you for choosing [facility], our hope is that these requests will decrease the amount of time that you wait before being seen by our physicians.  ______________________________________________________________________   Should you have questions after your visit to the [facility], please contact our office at [phone] between the hours of 8:30 and 4:30 p.m.    Voicemails left after 4:30p.m will not be returned until the following business day.     For prescription refill requests, please have your pharmacy contact us directly.  Please also try to allow 48 hours for prescription requests.     Please contact the scheduling department for questions regarding scheduling.  For scheduling of procedures such as PET scans, CT scans, MRI, Ultrasound, etc please contact central scheduling at [phone].     Resources For Cancer Patients and Caregivers:    Oncolink.org:  A wonderful resource for patients and healthcare providers for information regarding your disease, ways to tract your treatment, what to expect, etc.      American Cancer Society:  800-227-2345  Can help patients locate various types of support and financial assistance   Cancer Care: 1-800-813-HOPE (4673) Provides financial assistance, online support groups, medication/co-pay assistance.     [place] DSS:  336-641-3447 Where to apply  for food stamps, Medicaid, and utility assistance   Medicare Rights Center: 800-333-4114 Helps people with Medicare understand their rights and benefits, navigate the Medicare system, and secure the quality healthcare they deserve   SCAT: 336-333-6589 [place] [employer]'s shared-ride transportation service for eligible riders who have a disability that prevents them from riding the fixed route bus.     For additional information on assistance programs please contact our social worker:   Abigail Elmore:  [phone]  

## 2018-08-29 ENCOUNTER — Telehealth: Payer: Self-pay | Admitting: *Deleted

## 2018-08-29 NOTE — Telephone Encounter (Signed)
Per Dr. Grier Mitts request: contacted Dr. George Ina office at [facility] [phone] to request 2nd opinion for patient. Left message on Hematologic Navigator's VM with patient information, inc. Date of Birth and diagnosis and requested a call back to this office by navigator.

## 2018-08-30 ENCOUNTER — Telehealth: Payer: Self-pay | Admitting: *Deleted

## 2018-08-30 NOTE — Telephone Encounter (Signed)
Contacted by Vito Backers [phone]) from [place], requested patient demographics and last 2 office notes be faxed to [phone]

## 2018-08-30 NOTE — Telephone Encounter (Signed)
Faxed confirmation received from [facility] for demographics and last 2 notes. 08/30/18 1200

## 2018-09-12 ENCOUNTER — Telehealth: Payer: Self-pay | Admitting: *Deleted

## 2018-09-12 NOTE — Telephone Encounter (Signed)
Spouse called for update on referral to [facility] (Dr. Alvie Heidelberg). Advised her ppwk sent to [facility] 08/30/18. Verbalized understanding.

## 2018-10-03 ENCOUNTER — Encounter: Payer: Self-pay | Admitting: Family

## 2018-10-04 ENCOUNTER — Other Ambulatory Visit: Payer: Self-pay | Admitting: Family

## 2018-10-04 DIAGNOSIS — G479 Sleep disorder, unspecified: Secondary | ICD-10-CM

## 2018-10-04 MED ORDER — PAXIL CR 12.5 MG PO TB24: 12.5000 mg | ORAL_TABLET | Freq: Every morning | ORAL | 1 refills | Status: DC

## 2018-10-04 MED ORDER — ATORVASTATIN CALCIUM 20 MG PO TABS: 20.0000 mg | ORAL_TABLET | Freq: Every day | ORAL | 3 refills | Status: DC

## 2018-11-26 ENCOUNTER — Inpatient Hospital Stay: Payer: BLUE CROSS/BLUE SHIELD | Attending: Hematology

## 2018-11-26 DIAGNOSIS — Z79899 Other long term (current) drug therapy: Secondary | ICD-10-CM | POA: Insufficient documentation

## 2018-11-26 DIAGNOSIS — E8581 Light chain (AL) amyloidosis: Secondary | ICD-10-CM | POA: Insufficient documentation

## 2018-11-26 DIAGNOSIS — C9000 Multiple myeloma not having achieved remission: Secondary | ICD-10-CM | POA: Insufficient documentation

## 2018-11-26 LAB — CBC WITH DIFFERENTIAL/PLATELET
Abs Immature Granulocytes: 0.02 10*3/uL (ref 0.00–0.07)
BASOS ABS: 0 10*3/uL (ref 0.0–0.1)
Basophils Relative: 1 %
Eosinophils Absolute: 0.2 10*3/uL (ref 0.0–0.5)
Eosinophils Relative: 3 %
HEMATOCRIT: 44.8 % (ref 39.0–52.0)
HEMOGLOBIN: 15.8 g/dL (ref 13.0–17.0)
IMMATURE GRANULOCYTES: 0 %
LYMPHS PCT: 19 %
Lymphs Abs: 1.1 10*3/uL (ref 0.7–4.0)
MCH: 31 pg (ref 26.0–34.0)
MCHC: 35.3 g/dL (ref 30.0–36.0)
MCV: 87.8 fL (ref 80.0–100.0)
Monocytes Absolute: 0.5 10*3/uL (ref 0.1–1.0)
Monocytes Relative: 9 %
NEUTROS ABS: 4.1 10*3/uL (ref 1.7–7.7)
NEUTROS PCT: 68 %
NRBC: 0 % (ref 0.0–0.2)
Platelets: 211 10*3/uL (ref 150–400)
RBC: 5.1 MIL/uL (ref 4.22–5.81)
RDW: 12.7 % (ref 11.5–15.5)
WBC: 5.9 10*3/uL (ref 4.0–10.5)

## 2018-11-26 LAB — CMP (CANCER CENTER ONLY)
ALT: 38 U/L (ref 0–44)
ANION GAP: 9 (ref 5–15)
AST: 20 U/L (ref 15–41)
Albumin: 4.6 g/dL (ref 3.5–5.0)
Alkaline Phosphatase: 46 U/L (ref 38–126)
BUN: 14 mg/dL (ref 8–23)
CHLORIDE: 109 mmol/L (ref 98–111)
CO2: 23 mmol/L (ref 22–32)
Calcium: 9.5 mg/dL (ref 8.9–10.3)
Creatinine: 0.95 mg/dL (ref 0.61–1.24)
GFR, Estimated: >60 mL/min (ref >60)
Glucose, Bld: 100 mg/dL — ABNORMAL HIGH (ref 70–99)
Potassium: 4.3 mmol/L (ref 3.5–5.1)
SODIUM: 141 mmol/L (ref 135–145)
Total Bilirubin: 0.8 mg/dL (ref 0.3–1.2)
Total Protein: 7.5 g/dL (ref 6.5–8.1)

## 2018-11-27 LAB — KAPPA/LAMBDA LIGHT CHAINS
Kappa free light chain: 5.1 mg/L (ref 3.3–19.4)
Kappa, lambda light chain ratio: 0.63 (ref 0.26–1.65)
Lambda free light chains: 8.1 mg/L (ref 5.7–26.3)

## 2018-11-28 LAB — MULTIPLE MYELOMA PANEL, SERUM
ALBUMIN SERPL ELPH-MCNC: 4.4 g/dL (ref 2.9–4.4)
ALPHA 1: 0.2 g/dL (ref 0.0–0.4)
Albumin/Glob SerPl: 1.7 (ref 0.7–1.7)
Alpha2 Glob SerPl Elph-Mcnc: 0.6 g/dL (ref 0.4–1.0)
B-Globulin SerPl Elph-Mcnc: 1.1 g/dL (ref 0.7–1.3)
GAMMA GLOB SERPL ELPH-MCNC: 0.7 g/dL (ref 0.4–1.8)
GLOBULIN, TOTAL: 2.6 g/dL (ref 2.2–3.9)
IGM (IMMUNOGLOBULIN M), SRM: 78 mg/dL (ref 20–172)
IgA: 95 mg/dL (ref 61–437)
IgG (Immunoglobin G), Serum: 740 mg/dL (ref 700–1600)
M Protein SerPl Elph-Mcnc: 0.3 g/dL — ABNORMAL HIGH
TOTAL PROTEIN ELP: 7 g/dL (ref 6.0–8.5)

## 2018-12-02 DIAGNOSIS — E8581 Light chain (AL) amyloidosis: Secondary | ICD-10-CM | POA: Diagnosis not present

## 2018-12-02 DIAGNOSIS — Z79899 Other long term (current) drug therapy: Secondary | ICD-10-CM | POA: Diagnosis not present

## 2018-12-02 DIAGNOSIS — C9000 Multiple myeloma not having achieved remission: Secondary | ICD-10-CM | POA: Diagnosis not present
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
## 2018-12-03 ENCOUNTER — Encounter: Payer: Self-pay | Admitting: Hematology

## 2018-12-03 ENCOUNTER — Telehealth: Payer: Self-pay | Admitting: Hematology

## 2018-12-03 ENCOUNTER — Inpatient Hospital Stay: Payer: BLUE CROSS/BLUE SHIELD | Admitting: Hematology

## 2018-12-03 VITALS — BP 141/96 | HR 73 | Temp 97.9°F | Resp 18 | Ht >= 80 in | Wt 243.7 lb

## 2018-12-03 DIAGNOSIS — C9000 Multiple myeloma not having achieved remission: Secondary | ICD-10-CM

## 2018-12-03 DIAGNOSIS — Z79899 Other long term (current) drug therapy: Secondary | ICD-10-CM

## 2018-12-03 DIAGNOSIS — E8581 Light chain (AL) amyloidosis: Secondary | ICD-10-CM | POA: Diagnosis not present

## 2018-12-03 LAB — UPEP/TP, 24-HR URINE
ALPHA 1 UR: 1.4 %
Albumin, U: 57.1 %
Alpha 2, Urine: 5.8 %
BETA UR: 28.2 %
Gamma Globulin, Urine: 7.5 %
TOTAL PROTEIN, URINE-UR/DAY: 137 mg/(24.h) (ref 30–150)
TOTAL VOLUME: 1950
Total Protein, Urine: 7 mg/dL

## 2018-12-03 NOTE — Telephone Encounter (Signed)
Scheduled appt per 02/25 los. ° °Printed calendar and avs. °

## 2018-12-04 ENCOUNTER — Telehealth: Payer: Self-pay | Admitting: *Deleted

## 2018-12-04 NOTE — Telephone Encounter (Signed)
Contacted patient in response to MyChart question regarding ECHO ordered by Dr. Irene Limbo. Per Dr. Irene Limbo, advise patient that monitoring the heart is routine for amyloidosis and that last ECHO was a year ago. Patient verbalized understanding.

## 2018-12-05 ENCOUNTER — Telehealth: Payer: Self-pay | Admitting: Hematology

## 2018-12-05 NOTE — Telephone Encounter (Signed)
Faxed records to Dr Kendell Bane office.

## 2018-12-09 ENCOUNTER — Telehealth: Payer: Self-pay | Admitting: *Deleted

## 2018-12-09 NOTE — Telephone Encounter (Signed)
Faxed referral ppwk requested [all radiology, all pathology, last 3 office visits, initial consult] to Dr. Okey Regal @ [place] fax [phone], Thomasenia Sales [phone]) Fax confirmation received.

## 2018-12-17 ENCOUNTER — Ambulatory Visit (HOSPITAL_COMMUNITY)
Admission: RE | Admit: 2018-12-17 | Discharge: 2018-12-17 | Disposition: A | Discharge status: Home / Self Care | Payer: BLUE CROSS/BLUE SHIELD | Source: Ambulatory Visit | Attending: Hematology | Admitting: Hematology

## 2018-12-17 DIAGNOSIS — I429 Cardiomyopathy, unspecified: Secondary | ICD-10-CM | POA: Diagnosis not present

## 2018-12-17 DIAGNOSIS — E8581 Light chain (AL) amyloidosis: Secondary | ICD-10-CM | POA: Diagnosis not present

## 2018-12-17 DIAGNOSIS — C9000 Multiple myeloma not having achieved remission: Secondary | ICD-10-CM | POA: Insufficient documentation

## 2018-12-17 DIAGNOSIS — E785 Hyperlipidemia, unspecified: Secondary | ICD-10-CM | POA: Diagnosis not present

## 2018-12-17 DIAGNOSIS — I370 Nonrheumatic pulmonary valve stenosis: Secondary | ICD-10-CM | POA: Diagnosis not present

## 2018-12-17 NOTE — Progress Notes (Signed)
  Echocardiogram 2D Echocardiogram has been performed.  Zachary [name] 12/17/2018, 10:08 AM

## 2018-12-30 DIAGNOSIS — E8581 Light chain (AL) amyloidosis: Secondary | ICD-10-CM | POA: Diagnosis not present

## 2018-12-30 DIAGNOSIS — Q256 Stenosis of pulmonary artery: Secondary | ICD-10-CM | POA: Diagnosis not present

## 2018-12-31 DIAGNOSIS — E8589 Other amyloidosis: Secondary | ICD-10-CM | POA: Diagnosis not present

## 2018-12-31 DIAGNOSIS — C9000 Multiple myeloma not having achieved remission: Secondary | ICD-10-CM | POA: Diagnosis not present

## 2018-12-31 DIAGNOSIS — D4989 Neoplasm of unspecified behavior of other specified sites: Secondary | ICD-10-CM | POA: Diagnosis not present

## 2019-01-16 DIAGNOSIS — E8581 Light chain (AL) amyloidosis: Secondary | ICD-10-CM | POA: Diagnosis not present

## 2019-03-04 ENCOUNTER — Encounter: Payer: Self-pay | Admitting: Adult Health

## 2019-03-05 ENCOUNTER — Telehealth: Payer: Self-pay | Admitting: Neurology

## 2019-03-05 NOTE — Telephone Encounter (Signed)
Due to current COVID 19 pandemic, our office is severely reducing in office visits until further notice, in order to minimize the risk to our patients and healthcare providers.   Called patient and confirmed a virtual visit for his 6/4 appointment. Patient verbalized understanding of the [email or web address] process and I have sent him an e-mail with link and directions as well as my name and office number/hours for reference. Patient understands that he will receive a call from RN to update chart.  Pt understands that although there may be some limitations with this type of visit, we will take all precautions to reduce any security or privacy concerns.  Pt understands that this will be treated like an in office visit and we will file with pt's insurance, and there may be a patient responsible charge related to this service.

## 2019-03-12 NOTE — Telephone Encounter (Signed)
I called pt. Pt's meds, allergies, and PMH were updated.  Pt reports that he uses his cpap every night. [employer] is not picking up his wireless data. Will reach out to [employer] for assistance. Pt's cpap is otherwise going well.  Pt confirmed that he received the [email or web address] link and understands the doxy.me process.

## 2019-03-13 ENCOUNTER — Ambulatory Visit (INDEPENDENT_AMBULATORY_CARE_PROVIDER_SITE_OTHER): Payer: BC Managed Care – PPO | Admitting: Neurology

## 2019-03-13 ENCOUNTER — Other Ambulatory Visit: Payer: Self-pay

## 2019-03-13 ENCOUNTER — Encounter: Payer: Self-pay | Admitting: Neurology

## 2019-03-13 DIAGNOSIS — Z9989 Dependence on other enabling machines and devices: Secondary | ICD-10-CM | POA: Diagnosis not present

## 2019-03-13 DIAGNOSIS — G4733 Obstructive sleep apnea (adult) (pediatric): Secondary | ICD-10-CM | POA: Diagnosis not present

## 2019-03-13 NOTE — Patient Instructions (Signed)
Given verbally, during today's virtual video-based encounter, with verbal feedback received.   
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
## 2019-03-13 NOTE — Progress Notes (Signed)
Order for cpap supplies sent to [facility] via community message. Confirmation received that the order transmitted was successful.  

## 2019-03-17 ENCOUNTER — Telehealth: Payer: Self-pay

## 2019-03-17 NOTE — Telephone Encounter (Signed)
I called pt, scheduled him for a yearly follow up with MM. Pt verbalized understanding of new appt date and time.

## 2019-03-18 DIAGNOSIS — G4733 Obstructive sleep apnea (adult) (pediatric): Secondary | ICD-10-CM | POA: Diagnosis not present

## 2019-03-27 ENCOUNTER — Inpatient Hospital Stay: Payer: BC Managed Care – PPO | Attending: Hematology

## 2019-03-27 ENCOUNTER — Other Ambulatory Visit: Payer: Self-pay

## 2019-03-27 DIAGNOSIS — Z79899 Other long term (current) drug therapy: Secondary | ICD-10-CM | POA: Diagnosis not present

## 2019-03-27 DIAGNOSIS — E8581 Light chain (AL) amyloidosis: Secondary | ICD-10-CM

## 2019-03-27 DIAGNOSIS — C8581 Other specified types of non-Hodgkin lymphoma, lymph nodes of head, face, and neck: Secondary | ICD-10-CM | POA: Diagnosis not present

## 2019-03-27 DIAGNOSIS — F419 Anxiety disorder, unspecified: Secondary | ICD-10-CM | POA: Insufficient documentation

## 2019-03-27 DIAGNOSIS — C9000 Multiple myeloma not having achieved remission: Secondary | ICD-10-CM | POA: Diagnosis not present

## 2019-03-27 LAB — CMP (CANCER CENTER ONLY)
ALT: 24 U/L (ref 0–44)
AST: 19 U/L (ref 15–41)
Albumin: 4.6 g/dL (ref 3.5–5.0)
Alkaline Phosphatase: 50 U/L (ref 38–126)
Anion gap: 9 (ref 5–15)
BUN: 12 mg/dL (ref 8–23)
CO2: 24 mmol/L (ref 22–32)
Calcium: 9.3 mg/dL (ref 8.9–10.3)
Chloride: 108 mmol/L (ref 98–111)
Creatinine: 0.85 mg/dL (ref 0.61–1.24)
GFR, Est AFR Am: >60 mL/min (ref >60)
GFR, Estimated: >60 mL/min (ref >60)
Glucose, Bld: 99 mg/dL (ref 70–99)
Potassium: 4.2 mmol/L (ref 3.5–5.1)
Sodium: 141 mmol/L (ref 135–145)
Total Bilirubin: 0.8 mg/dL (ref 0.3–1.2)
Total Protein: 7.3 g/dL (ref 6.5–8.1)

## 2019-03-27 LAB — CBC WITH DIFFERENTIAL/PLATELET
Abs Immature Granulocytes: 0.01 10*3/uL (ref 0.00–0.07)
Basophils Absolute: 0 10*3/uL (ref 0.0–0.1)
Basophils Relative: 1 %
Eosinophils Absolute: 0.2 10*3/uL (ref 0.0–0.5)
Eosinophils Relative: 3 %
HCT: 45 % (ref 39.0–52.0)
Hemoglobin: 15.1 g/dL (ref 13.0–17.0)
Immature Granulocytes: 0 %
Lymphocytes Relative: 20 %
Lymphs Abs: 1.1 10*3/uL (ref 0.7–4.0)
MCH: 30.8 pg (ref 26.0–34.0)
MCHC: 33.6 g/dL (ref 30.0–36.0)
MCV: 91.8 fL (ref 80.0–100.0)
Monocytes Absolute: 0.5 10*3/uL (ref 0.1–1.0)
Monocytes Relative: 9 %
Neutro Abs: 3.5 10*3/uL (ref 1.7–7.7)
Neutrophils Relative %: 67 %
Platelets: 199 10*3/uL (ref 150–400)
RBC: 4.9 MIL/uL (ref 4.22–5.81)
RDW: 12.8 % (ref 11.5–15.5)
WBC: 5.3 10*3/uL (ref 4.0–10.5)
nRBC: 0 % (ref 0.0–0.2)

## 2019-03-28 LAB — KAPPA/LAMBDA LIGHT CHAINS
Kappa free light chain: 9.3 mg/L (ref 3.3–19.4)
Kappa, lambda light chain ratio: 1.29 (ref 0.26–1.65)
Lambda free light chains: 7.2 mg/L (ref 5.7–26.3)

## 2019-03-31 LAB — MULTIPLE MYELOMA PANEL, SERUM
Albumin SerPl Elph-Mcnc: 4.4 g/dL (ref 2.9–4.4)
Albumin/Glob SerPl: 2.1 — ABNORMAL HIGH (ref 0.7–1.7)
Alpha 1: 0.2 g/dL (ref 0.0–0.4)
Alpha2 Glob SerPl Elph-Mcnc: 0.5 g/dL (ref 0.4–1.0)
B-Globulin SerPl Elph-Mcnc: 0.9 g/dL (ref 0.7–1.3)
Gamma Glob SerPl Elph-Mcnc: 0.6 g/dL (ref 0.4–1.8)
Globulin, Total: 2.2 g/dL (ref 2.2–3.9)
IgA: 90 mg/dL (ref 61–437)
IgG (Immunoglobin G), Serum: 750 mg/dL (ref 603–1613)
IgM (Immunoglobulin M), Srm: 69 mg/dL (ref 20–172)
M Protein SerPl Elph-Mcnc: 0.3 g/dL — ABNORMAL HIGH
Total Protein ELP: 6.6 g/dL (ref 6.0–8.5)
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
## 2019-04-03 ENCOUNTER — Telehealth: Payer: Self-pay | Admitting: Hematology

## 2019-04-03 ENCOUNTER — Inpatient Hospital Stay (HOSPITAL_BASED_OUTPATIENT_CLINIC_OR_DEPARTMENT_OTHER): Payer: BC Managed Care – PPO | Admitting: Hematology

## 2019-04-03 ENCOUNTER — Other Ambulatory Visit: Payer: Self-pay

## 2019-04-03 VITALS — BP 160/90 | HR 72 | Temp 98.2°F | Resp 17 | Ht >= 80 in | Wt 234.6 lb

## 2019-04-03 DIAGNOSIS — E8581 Light chain (AL) amyloidosis: Secondary | ICD-10-CM | POA: Diagnosis not present

## 2019-04-03 DIAGNOSIS — C9000 Multiple myeloma not having achieved remission: Secondary | ICD-10-CM

## 2019-04-03 DIAGNOSIS — F419 Anxiety disorder, unspecified: Secondary | ICD-10-CM

## 2019-04-03 DIAGNOSIS — C8581 Other specified types of non-Hodgkin lymphoma, lymph nodes of head, face, and neck: Secondary | ICD-10-CM | POA: Diagnosis not present

## 2019-04-03 DIAGNOSIS — Z79899 Other long term (current) drug therapy: Secondary | ICD-10-CM

## 2019-04-03 DIAGNOSIS — D472 Monoclonal gammopathy: Secondary | ICD-10-CM

## 2019-04-03 NOTE — Telephone Encounter (Signed)
Appts already scheduled per 6/25 los. Sent referral thru [facility]icient.

## 2019-04-03 NOTE — Telephone Encounter (Signed)
Scheduled appt per 6/25 los - pt aware of appts . No referral placed at the moment.

## 2019-05-08 ENCOUNTER — Other Ambulatory Visit: Payer: Self-pay | Admitting: Family

## 2019-05-08 ENCOUNTER — Encounter: Payer: Self-pay | Admitting: Family

## 2019-05-08 DIAGNOSIS — G479 Sleep disorder, unspecified: Secondary | ICD-10-CM

## 2019-05-09 ENCOUNTER — Other Ambulatory Visit: Payer: Self-pay | Admitting: Family

## 2019-05-10 DIAGNOSIS — S61211A Laceration without foreign body of left index finger without damage to nail, initial encounter: Secondary | ICD-10-CM | POA: Diagnosis not present

## 2019-05-12 ENCOUNTER — Telehealth (INDEPENDENT_AMBULATORY_CARE_PROVIDER_SITE_OTHER): Payer: BC Managed Care – PPO | Admitting: Cardiology

## 2019-05-12 ENCOUNTER — Other Ambulatory Visit: Payer: Self-pay

## 2019-05-12 DIAGNOSIS — E782 Mixed hyperlipidemia: Secondary | ICD-10-CM | POA: Diagnosis not present

## 2019-05-12 DIAGNOSIS — I370 Nonrheumatic pulmonary valve stenosis: Secondary | ICD-10-CM

## 2019-05-12 NOTE — Patient Instructions (Signed)
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
## 2019-07-30 ENCOUNTER — Encounter: Payer: Self-pay | Admitting: Family

## 2019-07-30 ENCOUNTER — Ambulatory Visit (INDEPENDENT_AMBULATORY_CARE_PROVIDER_SITE_OTHER): Payer: BC Managed Care – PPO | Admitting: Family

## 2019-07-30 ENCOUNTER — Other Ambulatory Visit (INDEPENDENT_AMBULATORY_CARE_PROVIDER_SITE_OTHER): Payer: BC Managed Care – PPO

## 2019-07-30 ENCOUNTER — Other Ambulatory Visit: Payer: Self-pay

## 2019-07-30 VITALS — BP 130/76 | HR 69 | Temp 98.3°F | Ht >= 80 in | Wt 239.0 lb

## 2019-07-30 DIAGNOSIS — G479 Sleep disorder, unspecified: Secondary | ICD-10-CM

## 2019-07-30 DIAGNOSIS — Z125 Encounter for screening for malignant neoplasm of prostate: Secondary | ICD-10-CM | POA: Diagnosis not present

## 2019-07-30 DIAGNOSIS — R7309 Other abnormal glucose: Secondary | ICD-10-CM

## 2019-07-30 DIAGNOSIS — Z23 Encounter for immunization: Secondary | ICD-10-CM | POA: Diagnosis not present

## 2019-07-30 DIAGNOSIS — Z0000 Encounter for general adult medical examination without abnormal findings: Secondary | ICD-10-CM

## 2019-07-30 LAB — COMPREHENSIVE METABOLIC PANEL
ALT: 29 U/L (ref 0–53)
AST: 20 U/L (ref 0–37)
Albumin: 5 g/dL (ref 3.5–5.2)
Alkaline Phosphatase: 48 U/L (ref 39–117)
BUN: 16 mg/dL (ref 6–23)
CO2: 28 mEq/L (ref 19–32)
Calcium: 10.1 mg/dL (ref 8.4–10.5)
Chloride: 103 mEq/L (ref 96–112)
Creatinine, Ser: 0.9 mg/dL (ref 0.40–1.50)
GFR: 85.46 mL/min (ref >60.00)
Glucose, Bld: 92 mg/dL (ref 70–99)
Potassium: 4.3 mEq/L (ref 3.5–5.1)
Sodium: 140 mEq/L (ref 135–145)
Total Bilirubin: 0.9 mg/dL (ref 0.2–1.2)
Total Protein: 7.1 g/dL (ref 6.0–8.3)

## 2019-07-30 LAB — LIPID PANEL
Cholesterol: 255 mg/dL — ABNORMAL HIGH (ref 0–200)
HDL: 62.7 mg/dL (ref >39.00)
NonHDL: 192.76
Total CHOL/HDL Ratio: 4
Triglycerides: 232 mg/dL — ABNORMAL HIGH (ref 0.0–149.0)
VLDL: 46.4 mg/dL — ABNORMAL HIGH (ref 0.0–40.0)

## 2019-07-30 LAB — PSA: PSA: 1.33 ng/mL (ref 0.10–4.00)

## 2019-07-30 LAB — HEMOGLOBIN A1C: Hgb A1c MFr Bld: 4.8 % (ref 4.6–6.5)

## 2019-07-30 LAB — LDL CHOLESTEROL, DIRECT: Direct LDL: 154 mg/dL

## 2019-07-30 MED ORDER — PAROXETINE HCL ER 12.5 MG PO TB24: 12.5000 mg | ORAL_TABLET | Freq: Every morning | ORAL | 3 refills | Status: DC

## 2019-07-30 MED ORDER — ATORVASTATIN CALCIUM 20 MG PO TABS: 20.0000 mg | ORAL_TABLET | Freq: Every day | ORAL | 3 refills | Status: DC

## 2019-07-30 MED ORDER — SILDENAFIL CITRATE 100 MG PO TABS: 100.0000 mg | ORAL_TABLET | Freq: Every day | ORAL | 5 refills | Status: DC | PRN
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
## 2019-07-30 NOTE — Addendum Note (Signed)
Addended by: Marcina Millard on: 07/30/2019 01:57 PM   Modules accepted: Orders

## 2019-08-05 ENCOUNTER — Encounter: Payer: Self-pay | Admitting: Family

## 2019-08-08 ENCOUNTER — Other Ambulatory Visit: Payer: Self-pay | Admitting: Family

## 2019-08-08 DIAGNOSIS — E785 Hyperlipidemia, unspecified: Secondary | ICD-10-CM

## 2019-08-12 ENCOUNTER — Other Ambulatory Visit: Payer: Self-pay | Admitting: Family

## 2019-08-12 DIAGNOSIS — G479 Sleep disorder, unspecified: Secondary | ICD-10-CM

## 2019-08-12 MED ORDER — PAROXETINE HCL ER 12.5 MG PO TB24: 12.5000 mg | ORAL_TABLET | Freq: Every morning | ORAL | 1 refills | Status: DC

## 2019-09-10 ENCOUNTER — Inpatient Hospital Stay: Payer: BC Managed Care – PPO | Attending: Hematology

## 2019-09-10 ENCOUNTER — Other Ambulatory Visit: Payer: Self-pay

## 2019-09-10 DIAGNOSIS — F419 Anxiety disorder, unspecified: Secondary | ICD-10-CM | POA: Insufficient documentation

## 2019-09-10 DIAGNOSIS — Z79899 Other long term (current) drug therapy: Secondary | ICD-10-CM | POA: Diagnosis not present

## 2019-09-10 DIAGNOSIS — D472 Monoclonal gammopathy: Secondary | ICD-10-CM

## 2019-09-10 DIAGNOSIS — C8581 Other specified types of non-Hodgkin lymphoma, lymph nodes of head, face, and neck: Secondary | ICD-10-CM | POA: Diagnosis not present

## 2019-09-10 DIAGNOSIS — C9000 Multiple myeloma not having achieved remission: Secondary | ICD-10-CM

## 2019-09-10 DIAGNOSIS — E8581 Light chain (AL) amyloidosis: Secondary | ICD-10-CM

## 2019-09-10 LAB — CBC WITH DIFFERENTIAL/PLATELET
Abs Immature Granulocytes: 0.01 10*3/uL (ref 0.00–0.07)
Basophils Absolute: 0 10*3/uL (ref 0.0–0.1)
Basophils Relative: 1 %
Eosinophils Absolute: 0.1 10*3/uL (ref 0.0–0.5)
Eosinophils Relative: 2 %
HCT: 44.6 % (ref 39.0–52.0)
Hemoglobin: 15.1 g/dL (ref 13.0–17.0)
Immature Granulocytes: 0 %
Lymphocytes Relative: 16 %
Lymphs Abs: 1 10*3/uL (ref 0.7–4.0)
MCH: 30.9 pg (ref 26.0–34.0)
MCHC: 33.9 g/dL (ref 30.0–36.0)
MCV: 91.4 fL (ref 80.0–100.0)
Monocytes Absolute: 0.4 10*3/uL (ref 0.1–1.0)
Monocytes Relative: 7 %
Neutro Abs: 4.3 10*3/uL (ref 1.7–7.7)
Neutrophils Relative %: 74 %
Platelets: 226 10*3/uL (ref 150–400)
RBC: 4.88 MIL/uL (ref 4.22–5.81)
RDW: 12.8 % (ref 11.5–15.5)
WBC: 5.8 10*3/uL (ref 4.0–10.5)
nRBC: 0 % (ref 0.0–0.2)

## 2019-09-10 LAB — CMP (CANCER CENTER ONLY)
ALT: 32 U/L (ref 0–44)
AST: 22 U/L (ref 15–41)
Albumin: 4.5 g/dL (ref 3.5–5.0)
Alkaline Phosphatase: 52 U/L (ref 38–126)
Anion gap: 10 (ref 5–15)
BUN: 13 mg/dL (ref 8–23)
CO2: 26 mmol/L (ref 22–32)
Calcium: 9.3 mg/dL (ref 8.9–10.3)
Chloride: 104 mmol/L (ref 98–111)
Creatinine: 0.99 mg/dL (ref 0.61–1.24)
GFR, Est AFR Am: >60 mL/min (ref >60)
GFR, Estimated: >60 mL/min (ref >60)
Glucose, Bld: 163 mg/dL — ABNORMAL HIGH (ref 70–99)
Potassium: 3.7 mmol/L (ref 3.5–5.1)
Sodium: 140 mmol/L (ref 135–145)
Total Bilirubin: 0.6 mg/dL (ref 0.3–1.2)
Total Protein: 7.1 g/dL (ref 6.5–8.1)

## 2019-09-11 LAB — KAPPA/LAMBDA LIGHT CHAINS
Kappa free light chain: 6.3 mg/L (ref 3.3–19.4)
Kappa, lambda light chain ratio: 0.91 (ref 0.26–1.65)
Lambda free light chains: 6.9 mg/L (ref 5.7–26.3)

## 2019-09-12 LAB — MULTIPLE MYELOMA PANEL, SERUM
Albumin SerPl Elph-Mcnc: 4.5 g/dL — ABNORMAL HIGH (ref 2.9–4.4)
Albumin/Glob SerPl: 2.3 — ABNORMAL HIGH (ref 0.7–1.7)
Alpha 1: 0.2 g/dL (ref 0.0–0.4)
Alpha2 Glob SerPl Elph-Mcnc: 0.5 g/dL (ref 0.4–1.0)
B-Globulin SerPl Elph-Mcnc: 0.8 g/dL (ref 0.7–1.3)
Gamma Glob SerPl Elph-Mcnc: 0.6 g/dL (ref 0.4–1.8)
Globulin, Total: 2 g/dL — ABNORMAL LOW (ref 2.2–3.9)
IgA: 86 mg/dL (ref 61–437)
IgG (Immunoglobin G), Serum: 708 mg/dL (ref 603–1613)
IgM (Immunoglobulin M), Srm: 67 mg/dL (ref 20–172)
M Protein SerPl Elph-Mcnc: 0.3 g/dL — ABNORMAL HIGH
Total Protein ELP: 6.5 g/dL (ref 6.0–8.5)

## 2019-09-18 DIAGNOSIS — F419 Anxiety disorder, unspecified: Secondary | ICD-10-CM | POA: Diagnosis not present

## 2019-09-18 DIAGNOSIS — Z79899 Other long term (current) drug therapy: Secondary | ICD-10-CM | POA: Diagnosis not present

## 2019-09-18 DIAGNOSIS — C8581 Other specified types of non-Hodgkin lymphoma, lymph nodes of head, face, and neck: Secondary | ICD-10-CM | POA: Diagnosis not present

## 2019-09-18 DIAGNOSIS — C9000 Multiple myeloma not having achieved remission: Secondary | ICD-10-CM | POA: Diagnosis not present

## 2019-09-19 LAB — KAPPA/LAMBDA LIGHT CHAINS, FREE, WITH RATIO, 24HR. URINE
FR KAPPA LT CH,24HR: 19.09 mg/24 hr
FR LAMBDA LT CH,24HR: 2.94 mg/24 hr
Free Kappa Lt Chains,Ur: 9.09 mg/L (ref 0.63–113.79)
Free Kappa/Lambda Ratio: 6.49 (ref 1.03–31.76)
Free Lambda Lt Chains,Ur: 1.4 mg/L (ref 0.47–11.77)
Total Volume: 2100
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
## 2019-09-24 ENCOUNTER — Other Ambulatory Visit: Payer: Self-pay

## 2019-09-24 ENCOUNTER — Telehealth: Payer: Self-pay | Admitting: Hematology

## 2019-09-24 ENCOUNTER — Inpatient Hospital Stay (HOSPITAL_BASED_OUTPATIENT_CLINIC_OR_DEPARTMENT_OTHER): Payer: BC Managed Care – PPO | Admitting: Hematology

## 2019-09-24 VITALS — BP 145/80 | HR 62 | Temp 98.0°F | Resp 18 | Ht >= 80 in | Wt 237.6 lb

## 2019-09-24 DIAGNOSIS — F419 Anxiety disorder, unspecified: Secondary | ICD-10-CM | POA: Diagnosis not present

## 2019-09-24 DIAGNOSIS — C8581 Other specified types of non-Hodgkin lymphoma, lymph nodes of head, face, and neck: Secondary | ICD-10-CM | POA: Diagnosis not present

## 2019-09-24 DIAGNOSIS — Z79899 Other long term (current) drug therapy: Secondary | ICD-10-CM | POA: Diagnosis not present

## 2019-09-24 DIAGNOSIS — E8581 Light chain (AL) amyloidosis: Secondary | ICD-10-CM | POA: Diagnosis not present

## 2019-09-24 DIAGNOSIS — C9000 Multiple myeloma not having achieved remission: Secondary | ICD-10-CM

## 2019-09-24 DIAGNOSIS — D472 Monoclonal gammopathy: Secondary | ICD-10-CM

## 2019-09-24 NOTE — Telephone Encounter (Signed)
Scheduled appt per 12/16 los.   Patient declined calendar and avs.

## 2019-10-04 DIAGNOSIS — Z1159 Encounter for screening for other viral diseases: Secondary | ICD-10-CM | POA: Diagnosis not present

## 2019-10-13 ENCOUNTER — Encounter: Payer: Self-pay | Admitting: Hematology

## 2019-10-20 DIAGNOSIS — H52223 Regular astigmatism, bilateral: Secondary | ICD-10-CM | POA: Diagnosis not present

## 2019-10-20 DIAGNOSIS — H524 Presbyopia: Secondary | ICD-10-CM | POA: Diagnosis not present

## 2019-10-29 ENCOUNTER — Other Ambulatory Visit: Payer: Self-pay

## 2019-10-29 ENCOUNTER — Encounter: Payer: Self-pay | Admitting: Cardiology

## 2019-10-29 ENCOUNTER — Telehealth (INDEPENDENT_AMBULATORY_CARE_PROVIDER_SITE_OTHER): Payer: BC Managed Care – PPO | Admitting: Cardiology

## 2019-10-29 VITALS — Ht >= 80 in | Wt 230.0 lb

## 2019-10-29 DIAGNOSIS — E782 Mixed hyperlipidemia: Secondary | ICD-10-CM

## 2019-10-29 DIAGNOSIS — Z1329 Encounter for screening for other suspected endocrine disorder: Secondary | ICD-10-CM

## 2019-10-29 DIAGNOSIS — I370 Nonrheumatic pulmonary valve stenosis: Secondary | ICD-10-CM | POA: Diagnosis not present
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
## 2019-10-29 NOTE — Patient Instructions (Signed)
Medication Instructions:  Your physician recommends that you continue on your current medications as directed. Please refer to the Current Medication list given to you today.  *If you need a refill on your cardiac medications before your next appointment, please call your pharmacy*  Lab Work: Your physician recommends that you return FASTING for a BMP, CBC, TSH, hepatic and lipid to be drawn   If you have labs (blood work) drawn today and your tests are completely normal, you will receive your results only by: Marland Kitchen MyChart Message (if you have MyChart) OR . A paper copy in the mail If you have any lab test that is abnormal or we need to change your treatment, we will call you to review the results.  Testing/Procedures: NONE  Follow-Up: At [facility], you and your health needs are our priority.  As part of our continuing mission to provide you with exceptional heart care, we have created designated Provider Care Teams.  These Care Teams include your primary Cardiologist (physician) and Advanced Practice Providers (APPs -  Physician Assistants and Nurse Practitioners) who all work together to provide you with the care you need, when you need it.  Your next appointment:   6 month(s)  The format for your next appointment:   In Person  Provider:   Jyl Heinz, MD

## 2019-10-29 NOTE — Addendum Note (Signed)
Addended by: Beckey Rutter on: 10/29/2019 10:11 AM   Modules accepted: Orders

## 2019-11-07 ENCOUNTER — Other Ambulatory Visit (INDEPENDENT_AMBULATORY_CARE_PROVIDER_SITE_OTHER): Payer: BC Managed Care – PPO

## 2019-11-07 DIAGNOSIS — D472 Monoclonal gammopathy: Secondary | ICD-10-CM

## 2019-11-07 DIAGNOSIS — E785 Hyperlipidemia, unspecified: Secondary | ICD-10-CM | POA: Diagnosis not present

## 2019-11-07 DIAGNOSIS — C9000 Multiple myeloma not having achieved remission: Secondary | ICD-10-CM

## 2019-11-07 DIAGNOSIS — E8581 Light chain (AL) amyloidosis: Secondary | ICD-10-CM

## 2019-11-07 LAB — COMPREHENSIVE METABOLIC PANEL
ALT: 30 U/L (ref 0–53)
AST: 21 U/L (ref 0–37)
Albumin: 4.7 g/dL (ref 3.5–5.2)
Alkaline Phosphatase: 46 U/L (ref 39–117)
BUN: 12 mg/dL (ref 6–23)
CO2: 29 mEq/L (ref 19–32)
Calcium: 9.5 mg/dL (ref 8.4–10.5)
Chloride: 105 mEq/L (ref 96–112)
Creatinine, Ser: 0.89 mg/dL (ref 0.40–1.50)
GFR: 86.49 mL/min (ref >60.00)
Glucose, Bld: 99 mg/dL (ref 70–99)
Potassium: 4.2 mEq/L (ref 3.5–5.1)
Sodium: 139 mEq/L (ref 135–145)
Total Bilirubin: 0.9 mg/dL (ref 0.2–1.2)
Total Protein: 7.1 g/dL (ref 6.0–8.3)

## 2019-11-07 LAB — LIPID PANEL
Cholesterol: 190 mg/dL (ref 0–200)
HDL: 53.7 mg/dL (ref >39.00)
LDL Cholesterol: 115 mg/dL — ABNORMAL HIGH (ref 0–99)
NonHDL: 136.74
Total CHOL/HDL Ratio: 4
Triglycerides: 107 mg/dL (ref 0.0–149.0)
VLDL: 21.4 mg/dL (ref 0.0–40.0)

## 2019-11-28 ENCOUNTER — Other Ambulatory Visit: Payer: Self-pay | Admitting: Family

## 2019-11-28 ENCOUNTER — Encounter: Payer: Self-pay | Admitting: Family

## 2019-11-28 DIAGNOSIS — G479 Sleep disorder, unspecified: Secondary | ICD-10-CM

## 2019-11-28 MED ORDER — PAROXETINE HCL ER 12.5 MG PO TB24: 12.5000 mg | ORAL_TABLET | Freq: Every morning | ORAL | 1 refills | Status: DC

## 2019-12-05 DIAGNOSIS — G4733 Obstructive sleep apnea (adult) (pediatric): Secondary | ICD-10-CM | POA: Diagnosis not present

## 2020-02-06 DIAGNOSIS — Z20822 Contact with and (suspected) exposure to covid-19: Secondary | ICD-10-CM | POA: Diagnosis not present

## 2020-02-13 IMAGING — CT CT BIOPSY AND ASPIRATION BONE MARROW
1 of 2 series · 10 of 16 positions shown, 13 images · non-contrast
Comparison: none

INDICATION: 61 year-old with monoclonal gammopathy of unknown significance.

[Series 2: i-spiral 5.0 b40f · axial · 0.95mm/px · z∈[+1444,+1536]mm · 10 of 32 slices shown, 13 images]
[im 3/32  soft-tissue]
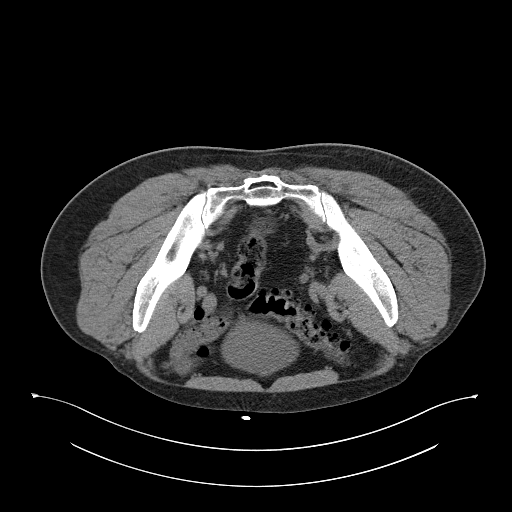
[im 3/32  bone]
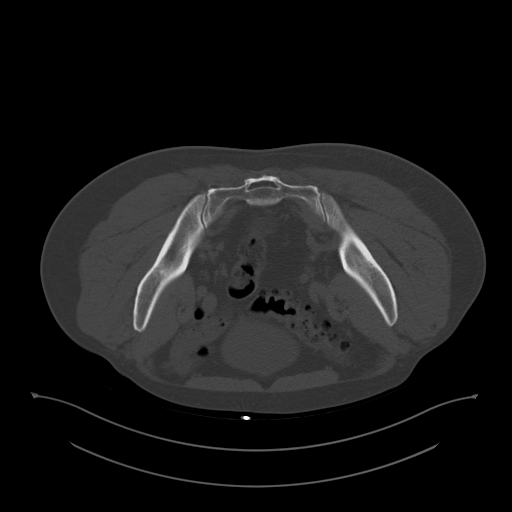
[im 6/32  soft-tissue]
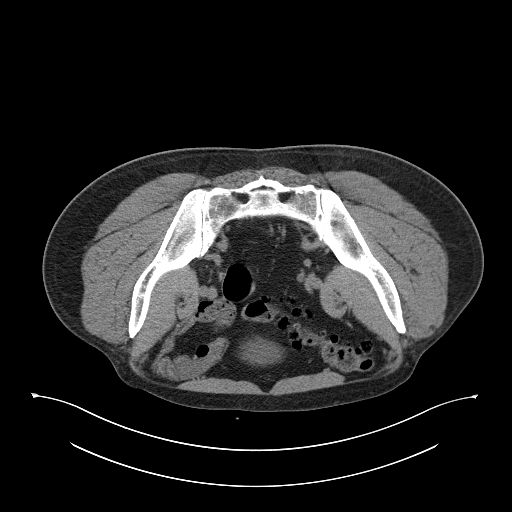
[im 9/32  soft-tissue]
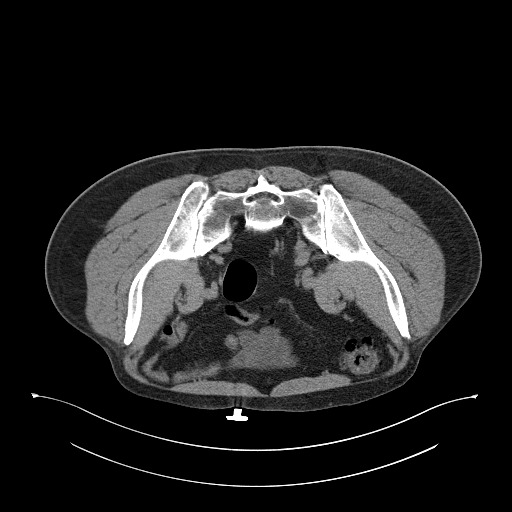
[im 12/32  soft-tissue]
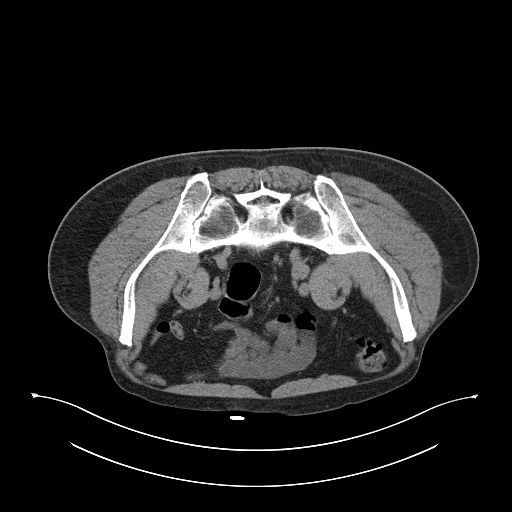
[im 15/32  soft-tissue]
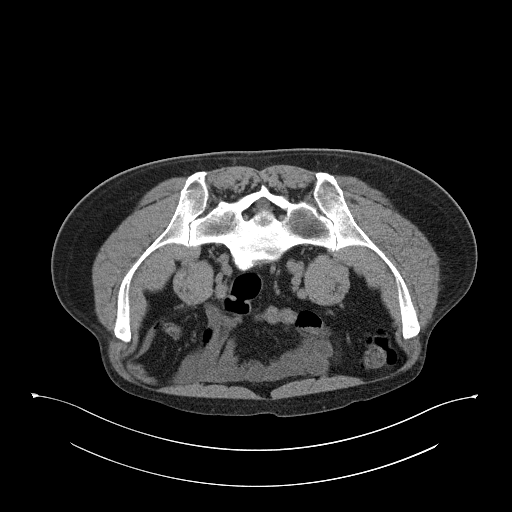
[im 15/32  bone]
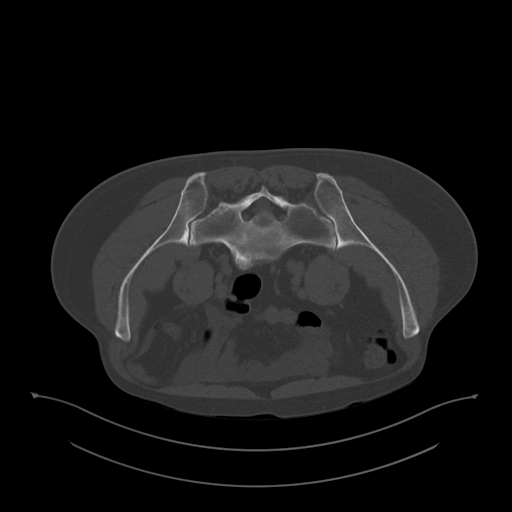
[im 17/32  soft-tissue]
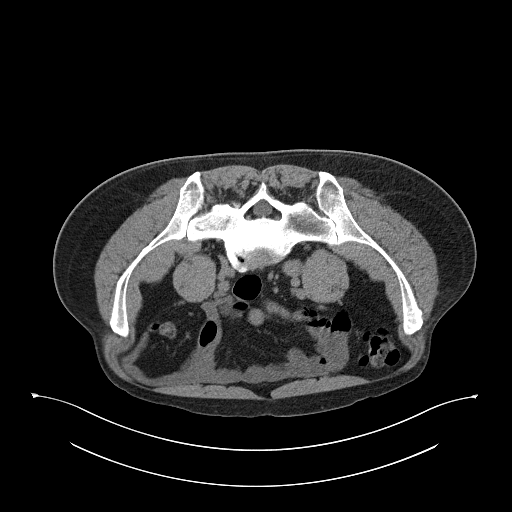
[im 20/32  soft-tissue]
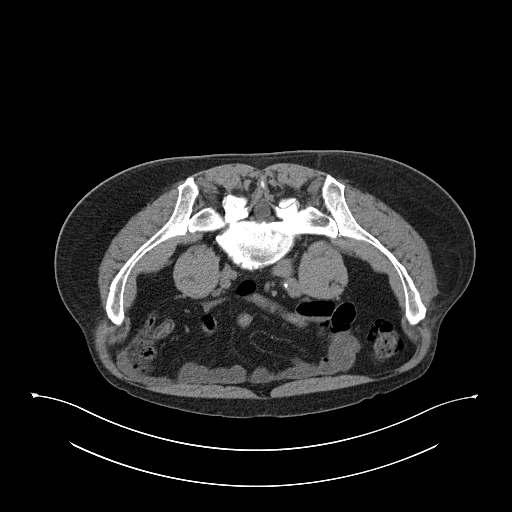
[im 23/32  soft-tissue]
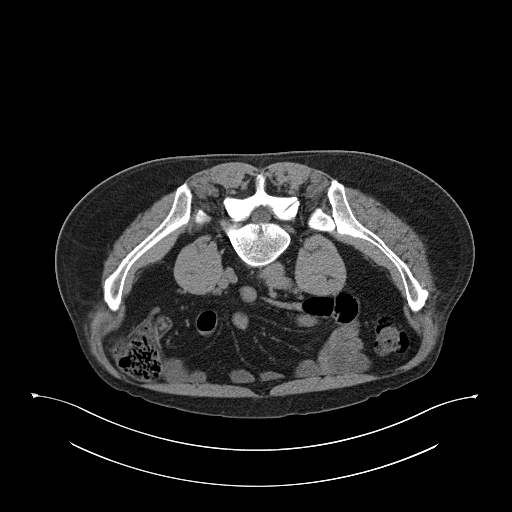
[im 26/32  soft-tissue]
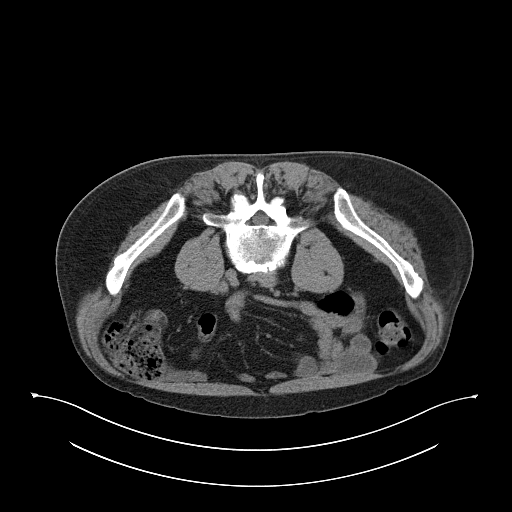
[im 26/32  bone]
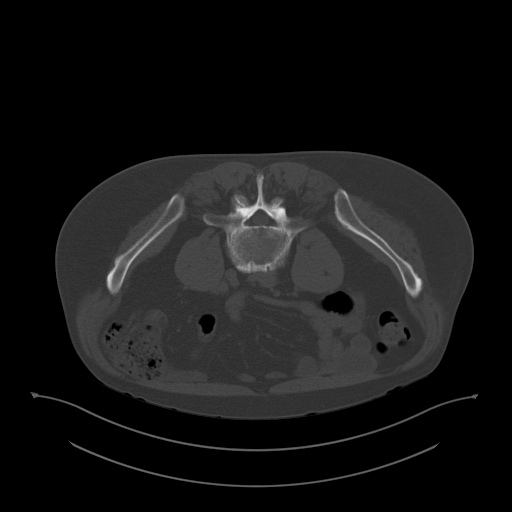
[im 29/32  soft-tissue]
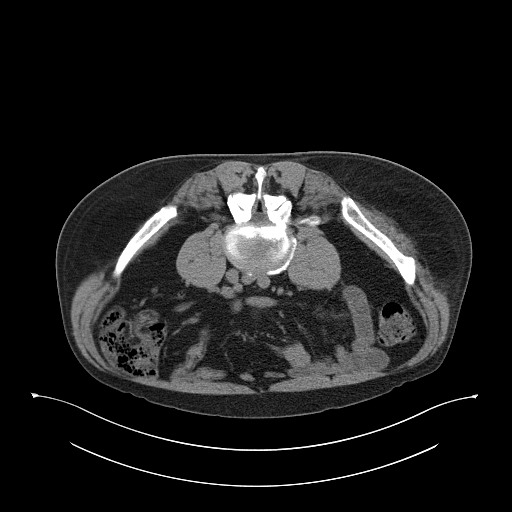

[10 of 16 positions shown; findings below may reference images not displayed]

EXAM:
CT GUIDED BONE MARROW ASPIRATES AND BIOPSY

MEDICATIONS:
None.

ANESTHESIA/SEDATION:
Fentanyl 100 mcg IV; Versed 2.0 mg IV

Moderate Sedation Time:  11 minutes

The patient was continuously monitored during the procedure by the
interventional radiology nurse under my direct supervision.

COMPLICATIONS:
None immediate.

PROCEDURE:
The procedure was explained to the patient. The risks and benefits
of the procedure were discussed and the patient's questions were
addressed. Informed consent was obtained from the patient. The
patient was placed prone on CT table. Images of the pelvis were
obtained. The right side of back was prepped and draped in sterile
fashion. The skin and right posterior ilium were anesthetized with
1% lidocaine. 11 gauge bone needle was directed into the right ilium
with CT guidance. Two aspirates and one core biopsy were obtained.
Bandage placed over the puncture site.
IMPRESSION: CT guided bone marrow aspiration and core biopsy.

## 2020-03-10 ENCOUNTER — Other Ambulatory Visit: Payer: Self-pay

## 2020-03-10 ENCOUNTER — Inpatient Hospital Stay: Payer: BC Managed Care – PPO | Attending: Hematology

## 2020-03-10 DIAGNOSIS — Z79899 Other long term (current) drug therapy: Secondary | ICD-10-CM | POA: Diagnosis not present

## 2020-03-10 DIAGNOSIS — E8581 Light chain (AL) amyloidosis: Secondary | ICD-10-CM

## 2020-03-10 DIAGNOSIS — F419 Anxiety disorder, unspecified: Secondary | ICD-10-CM | POA: Diagnosis not present

## 2020-03-10 DIAGNOSIS — C8581 Other specified types of non-Hodgkin lymphoma, lymph nodes of head, face, and neck: Secondary | ICD-10-CM | POA: Diagnosis not present

## 2020-03-10 DIAGNOSIS — C9000 Multiple myeloma not having achieved remission: Secondary | ICD-10-CM | POA: Insufficient documentation

## 2020-03-10 DIAGNOSIS — D472 Monoclonal gammopathy: Secondary | ICD-10-CM

## 2020-03-10 LAB — CMP (CANCER CENTER ONLY)
ALT: 32 U/L (ref 0–44)
AST: 23 U/L (ref 15–41)
Albumin: 4.4 g/dL (ref 3.5–5.0)
Alkaline Phosphatase: 52 U/L (ref 38–126)
Anion gap: 9 (ref 5–15)
BUN: 13 mg/dL (ref 8–23)
CO2: 25 mmol/L (ref 22–32)
Calcium: 9.3 mg/dL (ref 8.9–10.3)
Chloride: 107 mmol/L (ref 98–111)
Creatinine: 0.87 mg/dL (ref 0.61–1.24)
GFR, Est AFR Am: >60 mL/min (ref >60)
GFR, Estimated: >60 mL/min (ref >60)
Glucose, Bld: 96 mg/dL (ref 70–99)
Potassium: 4 mmol/L (ref 3.5–5.1)
Sodium: 141 mmol/L (ref 135–145)
Total Bilirubin: 0.9 mg/dL (ref 0.3–1.2)
Total Protein: 7.1 g/dL (ref 6.5–8.1)

## 2020-03-10 LAB — CBC WITH DIFFERENTIAL/PLATELET
Abs Immature Granulocytes: 0.03 10*3/uL (ref 0.00–0.07)
Basophils Absolute: 0 10*3/uL (ref 0.0–0.1)
Basophils Relative: 1 %
Eosinophils Absolute: 0.2 10*3/uL (ref 0.0–0.5)
Eosinophils Relative: 4 %
HCT: 43.2 % (ref 39.0–52.0)
Hemoglobin: 15 g/dL (ref 13.0–17.0)
Immature Granulocytes: 1 %
Lymphocytes Relative: 21 %
Lymphs Abs: 1.1 10*3/uL (ref 0.7–4.0)
MCH: 31.3 pg (ref 26.0–34.0)
MCHC: 34.7 g/dL (ref 30.0–36.0)
MCV: 90 fL (ref 80.0–100.0)
Monocytes Absolute: 0.5 10*3/uL (ref 0.1–1.0)
Monocytes Relative: 10 %
Neutro Abs: 3.5 10*3/uL (ref 1.7–7.7)
Neutrophils Relative %: 63 %
Platelets: 212 10*3/uL (ref 150–400)
RBC: 4.8 MIL/uL (ref 4.22–5.81)
RDW: 12.6 % (ref 11.5–15.5)
WBC: 5.4 10*3/uL (ref 4.0–10.5)
nRBC: 0 % (ref 0.0–0.2)

## 2020-03-11 LAB — KAPPA/LAMBDA LIGHT CHAINS
Kappa free light chain: 5.9 mg/L (ref 3.3–19.4)
Kappa, lambda light chain ratio: 0.84 (ref 0.26–1.65)
Lambda free light chains: 7 mg/L (ref 5.7–26.3)

## 2020-03-15 LAB — MULTIPLE MYELOMA PANEL, SERUM
Albumin SerPl Elph-Mcnc: 4.1 g/dL (ref 2.9–4.4)
Albumin/Glob SerPl: 1.7 (ref 0.7–1.7)
Alpha 1: 0.2 g/dL (ref 0.0–0.4)
Alpha2 Glob SerPl Elph-Mcnc: 0.6 g/dL (ref 0.4–1.0)
B-Globulin SerPl Elph-Mcnc: 1 g/dL (ref 0.7–1.3)
Gamma Glob SerPl Elph-Mcnc: 0.8 g/dL (ref 0.4–1.8)
Globulin, Total: 2.5 g/dL (ref 2.2–3.9)
IgA: 78 mg/dL (ref 61–437)
IgG (Immunoglobin G), Serum: 692 mg/dL (ref 603–1613)
IgM (Immunoglobulin M), Srm: 70 mg/dL (ref 20–172)
M Protein SerPl Elph-Mcnc: 0.4 g/dL — ABNORMAL HIGH
Total Protein ELP: 6.6 g/dL (ref 6.0–8.5)

## 2020-03-16 ENCOUNTER — Ambulatory Visit (INDEPENDENT_AMBULATORY_CARE_PROVIDER_SITE_OTHER): Payer: BC Managed Care – PPO | Admitting: Adult Health

## 2020-03-16 ENCOUNTER — Encounter: Payer: Self-pay | Admitting: Adult Health

## 2020-03-16 ENCOUNTER — Other Ambulatory Visit: Payer: Self-pay

## 2020-03-16 VITALS — BP 139/83 | HR 78 | Ht >= 80 in | Wt 246.0 lb

## 2020-03-16 DIAGNOSIS — G4733 Obstructive sleep apnea (adult) (pediatric): Secondary | ICD-10-CM | POA: Diagnosis not present

## 2020-03-16 DIAGNOSIS — Z9989 Dependence on other enabling machines and devices: Secondary | ICD-10-CM | POA: Diagnosis not present
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
## 2020-03-16 NOTE — Patient Instructions (Signed)
Try using CPAP nightly and greater than 4 hours each night If your symptoms worsen or you develop new symptoms please let us know.   

## 2020-03-17 DIAGNOSIS — C8581 Other specified types of non-Hodgkin lymphoma, lymph nodes of head, face, and neck: Secondary | ICD-10-CM | POA: Diagnosis not present

## 2020-03-17 DIAGNOSIS — F419 Anxiety disorder, unspecified: Secondary | ICD-10-CM | POA: Diagnosis not present

## 2020-03-17 DIAGNOSIS — C9000 Multiple myeloma not having achieved remission: Secondary | ICD-10-CM | POA: Diagnosis not present

## 2020-03-17 DIAGNOSIS — Z79899 Other long term (current) drug therapy: Secondary | ICD-10-CM | POA: Diagnosis not present

## 2020-03-18 LAB — UPEP/UIFE/LIGHT CHAINS/TP, 24-HR UR
% BETA, Urine: 16.2 %
ALPHA 1 URINE: 3.6 %
Albumin, U: 57.6 %
Alpha 2, Urine: 12.4 %
Free Kappa Lt Chains,Ur: 12.8 mg/L (ref 0.63–113.79)
Free Kappa/Lambda Ratio: 4.87 (ref 1.03–31.76)
Free Lambda Lt Chains,Ur: 2.63 mg/L (ref 0.47–11.77)
GAMMA GLOBULIN URINE: 10.2 %
Total Protein, Urine-Ur/day: 134 mg/24 hr (ref 30–150)
Total Protein, Urine: 8.4 mg/dL
Total Volume: 1600

## 2020-03-24 ENCOUNTER — Ambulatory Visit: Payer: BC Managed Care – PPO | Admitting: Hematology

## 2020-03-29 ENCOUNTER — Other Ambulatory Visit: Payer: Self-pay

## 2020-03-29 ENCOUNTER — Inpatient Hospital Stay (HOSPITAL_BASED_OUTPATIENT_CLINIC_OR_DEPARTMENT_OTHER): Payer: BC Managed Care – PPO | Admitting: Hematology

## 2020-03-29 VITALS — BP 151/87 | HR 78 | Temp 97.3°F | Resp 18 | Ht >= 80 in | Wt 241.9 lb

## 2020-03-29 DIAGNOSIS — C9000 Multiple myeloma not having achieved remission: Secondary | ICD-10-CM

## 2020-03-29 DIAGNOSIS — C8581 Other specified types of non-Hodgkin lymphoma, lymph nodes of head, face, and neck: Secondary | ICD-10-CM | POA: Diagnosis not present

## 2020-03-29 DIAGNOSIS — E859 Amyloidosis, unspecified: Secondary | ICD-10-CM

## 2020-03-29 DIAGNOSIS — D472 Monoclonal gammopathy: Secondary | ICD-10-CM

## 2020-03-29 DIAGNOSIS — Z79899 Other long term (current) drug therapy: Secondary | ICD-10-CM | POA: Diagnosis not present

## 2020-03-29 DIAGNOSIS — F419 Anxiety disorder, unspecified: Secondary | ICD-10-CM | POA: Diagnosis not present
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
## 2020-04-01 ENCOUNTER — Telehealth: Payer: Self-pay | Admitting: Hematology

## 2020-04-01 NOTE — Telephone Encounter (Signed)
Scheduled per 06/21 los, patient has been called and voicemail has been left.

## 2020-05-27 ENCOUNTER — Encounter: Payer: Self-pay | Admitting: Family

## 2020-06-20 ENCOUNTER — Other Ambulatory Visit: Payer: Self-pay | Admitting: Family

## 2020-06-20 DIAGNOSIS — G479 Sleep disorder, unspecified: Secondary | ICD-10-CM

## 2020-09-28 ENCOUNTER — Inpatient Hospital Stay: Payer: BC Managed Care – PPO | Attending: Hematology

## 2020-09-28 ENCOUNTER — Other Ambulatory Visit: Payer: Self-pay

## 2020-09-28 DIAGNOSIS — C9000 Multiple myeloma not having achieved remission: Secondary | ICD-10-CM | POA: Diagnosis not present

## 2020-09-28 DIAGNOSIS — F419 Anxiety disorder, unspecified: Secondary | ICD-10-CM | POA: Insufficient documentation

## 2020-09-28 DIAGNOSIS — D472 Monoclonal gammopathy: Secondary | ICD-10-CM

## 2020-09-28 DIAGNOSIS — Z79899 Other long term (current) drug therapy: Secondary | ICD-10-CM | POA: Insufficient documentation

## 2020-09-28 DIAGNOSIS — C8581 Other specified types of non-Hodgkin lymphoma, lymph nodes of head, face, and neck: Secondary | ICD-10-CM | POA: Insufficient documentation

## 2020-09-28 DIAGNOSIS — E859 Amyloidosis, unspecified: Secondary | ICD-10-CM

## 2020-09-28 LAB — CMP (CANCER CENTER ONLY)
ALT: 43 U/L (ref 0–44)
AST: 28 U/L (ref 15–41)
Albumin: 4.9 g/dL (ref 3.5–5.0)
Alkaline Phosphatase: 58 U/L (ref 38–126)
Anion gap: 12 (ref 5–15)
BUN: 15 mg/dL (ref 8–23)
CO2: 25 mmol/L (ref 22–32)
Calcium: 10.4 mg/dL — ABNORMAL HIGH (ref 8.9–10.3)
Chloride: 105 mmol/L (ref 98–111)
Creatinine: 1.06 mg/dL (ref 0.61–1.24)
GFR, Estimated: >60 mL/min (ref >60)
Glucose, Bld: 103 mg/dL — ABNORMAL HIGH (ref 70–99)
Potassium: 4.3 mmol/L (ref 3.5–5.1)
Sodium: 142 mmol/L (ref 135–145)
Total Bilirubin: 1.3 mg/dL — ABNORMAL HIGH (ref 0.3–1.2)
Total Protein: 8.3 g/dL — ABNORMAL HIGH (ref 6.5–8.1)

## 2020-09-28 LAB — CBC WITH DIFFERENTIAL/PLATELET
Abs Immature Granulocytes: 0.03 10*3/uL (ref 0.00–0.07)
Basophils Absolute: 0 10*3/uL (ref 0.0–0.1)
Basophils Relative: 1 %
Eosinophils Absolute: 0.2 10*3/uL (ref 0.0–0.5)
Eosinophils Relative: 3 %
HCT: 47.6 % (ref 39.0–52.0)
Hemoglobin: 16.3 g/dL (ref 13.0–17.0)
Immature Granulocytes: 1 %
Lymphocytes Relative: 20 %
Lymphs Abs: 1.2 10*3/uL (ref 0.7–4.0)
MCH: 30.9 pg (ref 26.0–34.0)
MCHC: 34.2 g/dL (ref 30.0–36.0)
MCV: 90.3 fL (ref 80.0–100.0)
Monocytes Absolute: 0.6 10*3/uL (ref 0.1–1.0)
Monocytes Relative: 10 %
Neutro Abs: 3.8 10*3/uL (ref 1.7–7.7)
Neutrophils Relative %: 65 %
Platelets: 232 10*3/uL (ref 150–400)
RBC: 5.27 MIL/uL (ref 4.22–5.81)
RDW: 12.6 % (ref 11.5–15.5)
WBC: 5.8 10*3/uL (ref 4.0–10.5)
nRBC: 0 % (ref 0.0–0.2)

## 2020-09-29 LAB — KAPPA/LAMBDA LIGHT CHAINS
Kappa free light chain: 5.4 mg/L (ref 3.3–19.4)
Kappa, lambda light chain ratio: 0.67 (ref 0.26–1.65)
Lambda free light chains: 8.1 mg/L (ref 5.7–26.3)

## 2020-09-30 LAB — MULTIPLE MYELOMA PANEL, SERUM
Albumin SerPl Elph-Mcnc: 4.9 g/dL — ABNORMAL HIGH (ref 2.9–4.4)
Albumin/Glob SerPl: 1.8 — ABNORMAL HIGH (ref 0.7–1.7)
Alpha 1: 0.2 g/dL (ref 0.0–0.4)
Alpha2 Glob SerPl Elph-Mcnc: 0.7 g/dL (ref 0.4–1.0)
B-Globulin SerPl Elph-Mcnc: 1.1 g/dL (ref 0.7–1.3)
Gamma Glob SerPl Elph-Mcnc: 0.8 g/dL (ref 0.4–1.8)
Globulin, Total: 2.8 g/dL (ref 2.2–3.9)
IgA: 91 mg/dL (ref 61–437)
IgG (Immunoglobin G), Serum: 782 mg/dL (ref 603–1613)
IgM (Immunoglobulin M), Srm: 75 mg/dL (ref 20–172)
M Protein SerPl Elph-Mcnc: 0.3 g/dL — ABNORMAL HIGH
Total Protein ELP: 7.7 g/dL (ref 6.0–8.5)

## 2020-10-12 ENCOUNTER — Other Ambulatory Visit: Payer: Self-pay

## 2020-10-12 ENCOUNTER — Inpatient Hospital Stay: Payer: BC Managed Care – PPO | Attending: Hematology | Admitting: Hematology

## 2020-10-12 DIAGNOSIS — D472 Monoclonal gammopathy: Secondary | ICD-10-CM

## 2020-10-12 DIAGNOSIS — E8581 Light chain (AL) amyloidosis: Secondary | ICD-10-CM

## 2020-10-12 DIAGNOSIS — C9000 Multiple myeloma not having achieved remission: Secondary | ICD-10-CM
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
## 2020-11-16 ENCOUNTER — Encounter: Payer: Self-pay | Admitting: Adult Health

## 2020-12-08 DIAGNOSIS — H43812 Vitreous degeneration, left eye: Secondary | ICD-10-CM | POA: Diagnosis not present

## 2020-12-08 DIAGNOSIS — H43393 Other vitreous opacities, bilateral: Secondary | ICD-10-CM | POA: Diagnosis not present

## 2020-12-08 DIAGNOSIS — H35373 Puckering of macula, bilateral: Secondary | ICD-10-CM | POA: Diagnosis not present

## 2020-12-08 DIAGNOSIS — H2513 Age-related nuclear cataract, bilateral: Secondary | ICD-10-CM | POA: Diagnosis not present

## 2020-12-18 ENCOUNTER — Other Ambulatory Visit: Payer: Self-pay | Admitting: Family

## 2020-12-18 DIAGNOSIS — G479 Sleep disorder, unspecified: Secondary | ICD-10-CM

## 2020-12-20 DIAGNOSIS — G4730 Sleep apnea, unspecified: Secondary | ICD-10-CM | POA: Insufficient documentation

## 2020-12-20 DIAGNOSIS — E785 Hyperlipidemia, unspecified: Secondary | ICD-10-CM | POA: Insufficient documentation

## 2020-12-20 DIAGNOSIS — S2249XA Multiple fractures of ribs, unspecified side, initial encounter for closed fracture: Secondary | ICD-10-CM | POA: Insufficient documentation

## 2020-12-21 ENCOUNTER — Other Ambulatory Visit: Payer: Self-pay

## 2020-12-21 ENCOUNTER — Encounter: Payer: Self-pay | Admitting: Cardiology

## 2020-12-21 ENCOUNTER — Ambulatory Visit: Payer: BC Managed Care – PPO | Admitting: Cardiology

## 2020-12-21 VITALS — BP 140/78 | HR 69 | Ht >= 80 in | Wt 247.0 lb

## 2020-12-21 DIAGNOSIS — E782 Mixed hyperlipidemia: Secondary | ICD-10-CM

## 2020-12-21 DIAGNOSIS — Q221 Congenital pulmonary valve stenosis: Secondary | ICD-10-CM | POA: Insufficient documentation
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
## 2020-12-21 NOTE — Patient Instructions (Signed)
Medication Instructions:  No medication changes. *If you need a refill on your cardiac medications before your next appointment, please call your pharmacy*   Lab Work: None ordered If you have labs (blood work) drawn today and your tests are completely normal, you will receive your results only by: MyChart Message (if you have MyChart) OR A paper copy in the mail If you have any lab test that is abnormal or we need to change your treatment, we will call you to review the results.   Testing/Procedures: Your physician has requested that you have an echocardiogram. Echocardiography is a painless test that uses sound waves to create images of your heart. It provides your doctor with information about the size and shape of your heart and how well your heart's chambers and valves are working. This procedure takes approximately one hour. There are no restrictions for this procedure.    Follow-Up: At [facility], you and your health needs are our priority.  As part of our continuing mission to provide you with exceptional heart care, we have created designated Provider Care Teams.  These Care Teams include your primary Cardiologist (physician) and Advanced Practice Providers (APPs -  Physician Assistants and Nurse Practitioners) who all work together to provide you with the care you need, when you need it.  We recommend signing up for the patient portal called "MyChart".  Sign up information is provided on this After Visit Summary.  MyChart is used to connect with patients for Virtual Visits (Telemedicine).  Patients are able to view lab/test results, encounter notes, upcoming appointments, etc.  Non-urgent messages can be sent to your provider as well.   To learn more about what you can do with MyChart, go to [email or web address].[email or web address].    Your next appointment:   3 month(s)  The format for your next appointment:   In Person  Provider:   Rajan Revankar, MD   Other  Instructions Echocardiogram An echocardiogram is a test that uses sound waves (ultrasound) to produce images of the heart. Images from an echocardiogram can provide important information about: Heart size and shape. The size and thickness and movement of your heart's walls. Heart muscle function and strength. Heart valve function or if you have stenosis. Stenosis is when the heart valves are too narrow. If blood is flowing backward through the heart valves (regurgitation). A tumor or infectious growth around the heart valves. Areas of heart muscle that are not working well because of poor blood flow or injury from a heart attack. Aneurysm detection. An aneurysm is a weak or damaged part of an artery wall. The wall bulges out from the normal force of blood pumping through the body. Tell a health care provider about: Any allergies you have. All medicines you are taking, including vitamins, herbs, eye drops, creams, and over-the-counter medicines. Any blood disorders you have. Any surgeries you have had. Any medical conditions you have. Whether you are pregnant or may be pregnant. What are the risks? Generally, this is a safe test. However, problems may occur, including an allergic reaction to dye (contrast) that may be used during the test. What happens before the test? No specific preparation is needed. You may eat and drink normally. What happens during the test? You will take off your clothes from the waist up and put on a hospital gown. Electrodes or electrocardiogram (ECG)patches may be placed on your chest. The electrodes or patches are then connected to a device that monitors your heart rate and rhythm. You will   lie down on a table for an ultrasound exam. A gel will be applied to your chest to help sound waves pass through your skin. A handheld device, called a transducer, will be pressed against your chest and moved over your heart. The transducer produces sound waves that travel to  your heart and bounce back (or "echo" back) to the transducer. These sound waves will be captured in real-time and changed into images of your heart that can be viewed on a video monitor. The images will be recorded on a computer and reviewed by your health care provider. You may be asked to change positions or hold your breath for a short time. This makes it easier to get different views or better views of your heart. In some cases, you may receive contrast through an IV in one of your veins. This can improve the quality of the pictures from your heart. The procedure may vary among health care providers and hospitals.   What can I expect after the test? You may return to your normal, everyday life, including diet, activities, and medicines, unless your health care provider tells you not to do that. Follow these instructions at home: It is up to you to get the results of your test. Ask your health care provider, or the department that is doing the test, when your results will be ready. Keep all follow-up visits. This is important. Summary An echocardiogram is a test that uses sound waves (ultrasound) to produce images of the heart. Images from an echocardiogram can provide important information about the size and shape of your heart, heart muscle function, heart valve function, and other possible heart problems. You do not need to do anything to prepare before this test. You may eat and drink normally. After the echocardiogram is completed, you may return to your normal, everyday life, unless your health care provider tells you not to do that. This information is not intended to replace advice given to you by your health care provider. Make sure you discuss any questions you have with your health care provider. Document Revised: 05/18/2020 Document Reviewed: 05/18/2020 Elsevier Patient Education  2021 Elsevier Inc.   

## 2021-01-26 ENCOUNTER — Ambulatory Visit (HOSPITAL_BASED_OUTPATIENT_CLINIC_OR_DEPARTMENT_OTHER)
Admission: RE | Admit: 2021-01-26 | Discharge: 2021-01-26 | Disposition: A | Discharge status: Home / Self Care | Payer: BC Managed Care – PPO | Source: Ambulatory Visit | Attending: Cardiology | Admitting: Cardiology

## 2021-01-26 ENCOUNTER — Other Ambulatory Visit: Payer: Self-pay

## 2021-01-26 DIAGNOSIS — Q221 Congenital pulmonary valve stenosis: Secondary | ICD-10-CM | POA: Diagnosis not present

## 2021-01-26 LAB — ECHOCARDIOGRAM COMPLETE
AR max vel: 3.9 cm2
AV Area VTI: 4.22 cm2
AV Area mean vel: 3.24 cm2
AV Mean grad: 3 mmHg
AV Peak grad: 5.3 mmHg
Ao pk vel: 1.15 m/s
Area-P 1/2: 3.02 cm2
S' Lateral: 3.58 cm

## 2021-02-01 ENCOUNTER — Other Ambulatory Visit: Payer: Self-pay

## 2021-02-01 ENCOUNTER — Inpatient Hospital Stay: Payer: BC Managed Care – PPO | Attending: Hematology

## 2021-02-01 DIAGNOSIS — C9000 Multiple myeloma not having achieved remission: Secondary | ICD-10-CM | POA: Insufficient documentation

## 2021-02-01 DIAGNOSIS — E8581 Light chain (AL) amyloidosis: Secondary | ICD-10-CM

## 2021-02-01 DIAGNOSIS — D472 Monoclonal gammopathy: Secondary | ICD-10-CM

## 2021-02-01 LAB — CMP (CANCER CENTER ONLY)
ALT: 23 U/L (ref 0–44)
AST: 22 U/L (ref 15–41)
Albumin: 4.5 g/dL (ref 3.5–5.0)
Alkaline Phosphatase: 52 U/L (ref 38–126)
Anion gap: 10 (ref 5–15)
BUN: 19 mg/dL (ref 8–23)
CO2: 26 mmol/L (ref 22–32)
Calcium: 9.4 mg/dL (ref 8.9–10.3)
Chloride: 105 mmol/L (ref 98–111)
Creatinine: 0.9 mg/dL (ref 0.61–1.24)
GFR, Estimated: >60 mL/min (ref >60)
Glucose, Bld: 102 mg/dL — ABNORMAL HIGH (ref 70–99)
Potassium: 4.4 mmol/L (ref 3.5–5.1)
Sodium: 141 mmol/L (ref 135–145)
Total Bilirubin: 0.9 mg/dL (ref 0.3–1.2)
Total Protein: 7 g/dL (ref 6.5–8.1)

## 2021-02-01 LAB — CBC WITH DIFFERENTIAL/PLATELET
Abs Immature Granulocytes: 0.01 10*3/uL (ref 0.00–0.07)
Basophils Absolute: 0 10*3/uL (ref 0.0–0.1)
Basophils Relative: 1 %
Eosinophils Absolute: 0.2 10*3/uL (ref 0.0–0.5)
Eosinophils Relative: 3 %
HCT: 43.1 % (ref 39.0–52.0)
Hemoglobin: 15.3 g/dL (ref 13.0–17.0)
Immature Granulocytes: 0 %
Lymphocytes Relative: 22 %
Lymphs Abs: 1.1 10*3/uL (ref 0.7–4.0)
MCH: 31 pg (ref 26.0–34.0)
MCHC: 35.5 g/dL (ref 30.0–36.0)
MCV: 87.2 fL (ref 80.0–100.0)
Monocytes Absolute: 0.5 10*3/uL (ref 0.1–1.0)
Monocytes Relative: 9 %
Neutro Abs: 3.2 10*3/uL (ref 1.7–7.7)
Neutrophils Relative %: 65 %
Platelets: 208 10*3/uL (ref 150–400)
RBC: 4.94 MIL/uL (ref 4.22–5.81)
RDW: 12.5 % (ref 11.5–15.5)
WBC: 4.9 10*3/uL (ref 4.0–10.5)
nRBC: 0 % (ref 0.0–0.2)

## 2021-02-02 DIAGNOSIS — G4733 Obstructive sleep apnea (adult) (pediatric): Secondary | ICD-10-CM | POA: Diagnosis not present

## 2021-02-02 LAB — KAPPA/LAMBDA LIGHT CHAINS
Kappa free light chain: 5.8 mg/L (ref 3.3–19.4)
Kappa, lambda light chain ratio: 0.74 (ref 0.26–1.65)
Lambda free light chains: 7.8 mg/L (ref 5.7–26.3)

## 2021-02-03 LAB — MULTIPLE MYELOMA PANEL, SERUM
Albumin SerPl Elph-Mcnc: 4.4 g/dL (ref 2.9–4.4)
Albumin/Glob SerPl: 1.9 — ABNORMAL HIGH (ref 0.7–1.7)
Alpha 1: 0.2 g/dL (ref 0.0–0.4)
Alpha2 Glob SerPl Elph-Mcnc: 0.6 g/dL (ref 0.4–1.0)
B-Globulin SerPl Elph-Mcnc: 1 g/dL (ref 0.7–1.3)
Gamma Glob SerPl Elph-Mcnc: 0.7 g/dL (ref 0.4–1.8)
Globulin, Total: 2.4 g/dL (ref 2.2–3.9)
IgA: 92 mg/dL (ref 61–437)
IgG (Immunoglobin G), Serum: 705 mg/dL (ref 603–1613)
IgM (Immunoglobulin M), Srm: 70 mg/dL (ref 20–172)
M Protein SerPl Elph-Mcnc: 0.3 g/dL — ABNORMAL HIGH
Total Protein ELP: 6.8 g/dL (ref 6.0–8.5)

## 2021-02-07 ENCOUNTER — Other Ambulatory Visit: Payer: Self-pay | Admitting: *Deleted

## 2021-02-07 DIAGNOSIS — C9000 Multiple myeloma not having achieved remission: Secondary | ICD-10-CM

## 2021-02-07 DIAGNOSIS — D472 Monoclonal gammopathy: Secondary | ICD-10-CM | POA: Diagnosis not present

## 2021-02-07 DIAGNOSIS — E8581 Light chain (AL) amyloidosis: Secondary | ICD-10-CM

## 2021-02-08 ENCOUNTER — Ambulatory Visit (HOSPITAL_COMMUNITY)
Admission: RE | Admit: 2021-02-08 | Discharge: 2021-02-08 | Disposition: A | Discharge status: Home / Self Care | Payer: BC Managed Care – PPO | Source: Ambulatory Visit | Attending: Hematology | Admitting: Hematology

## 2021-02-08 ENCOUNTER — Other Ambulatory Visit: Payer: Self-pay

## 2021-02-08 ENCOUNTER — Encounter: Payer: Self-pay | Admitting: Adult Health

## 2021-02-08 ENCOUNTER — Telehealth: Payer: Self-pay

## 2021-02-08 DIAGNOSIS — D472 Monoclonal gammopathy: Secondary | ICD-10-CM

## 2021-02-08 DIAGNOSIS — C9000 Multiple myeloma not having achieved remission: Secondary | ICD-10-CM | POA: Insufficient documentation

## 2021-02-08 DIAGNOSIS — E8581 Light chain (AL) amyloidosis: Secondary | ICD-10-CM | POA: Diagnosis not present

## 2021-02-08 LAB — GLUCOSE, CAPILLARY: Glucose-Capillary: 107 mg/dL — ABNORMAL HIGH (ref 70–99)

## 2021-02-08 MED ORDER — FLUDEOXYGLUCOSE F - 18 (FDG) INJECTION
12.8000 | Freq: Once | INTRAVENOUS | Status: AC | PRN
  Administered 2021-02-08: 11.82 via INTRAVENOUS

## 2021-02-08 NOTE — Telephone Encounter (Signed)
-----   Message from Jenean Lindau, MD sent at 02/07/2021  5:26 PM EDT ----- Mild to moderate pulm stenosis which is unchanged.  The results of the study is unremarkable. Please inform patient. I will discuss in detail at next appointment. Cc  primary care/referring physician Jenean Lindau, MD 02/07/2021 5:26 PM

## 2021-02-08 NOTE — Telephone Encounter (Signed)
Spoke with patient regarding results and recommendation.  Patient verbalizes understanding and is agreeable to plan of care. Advised patient to call back with any issues or concerns.  

## 2021-02-08 NOTE — Telephone Encounter (Signed)
Yes lets try another carrier or DME company

## 2021-02-09 LAB — UPEP/UIFE/LIGHT CHAINS/TP, 24-HR UR
% BETA, Urine: 0 %
ALPHA 1 URINE: 0 %
Albumin, U: 100 %
Alpha 2, Urine: 0 %
Free Kappa Lt Chains,Ur: 3.04 mg/L (ref 1.17–86.46)
Free Kappa/Lambda Ratio: 1.5 — ABNORMAL LOW (ref 1.83–14.26)
Free Lambda Lt Chains,Ur: 2.02 mg/L (ref 0.27–15.21)
GAMMA GLOBULIN URINE: 0 %
Total Protein, Urine-Ur/day: 126 mg/24 hr (ref 30–150)
Total Protein, Urine: 5.3 mg/dL
Total Volume: 2375
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
## 2021-02-15 ENCOUNTER — Other Ambulatory Visit: Payer: Self-pay

## 2021-02-15 ENCOUNTER — Inpatient Hospital Stay: Payer: BC Managed Care – PPO | Attending: Hematology | Admitting: Hematology

## 2021-02-15 VITALS — BP 136/95 | HR 73 | Temp 97.6°F | Resp 20 | Ht >= 80 in | Wt 241.6 lb

## 2021-02-15 DIAGNOSIS — D472 Monoclonal gammopathy: Secondary | ICD-10-CM | POA: Diagnosis not present

## 2021-02-15 DIAGNOSIS — C9000 Multiple myeloma not having achieved remission: Secondary | ICD-10-CM | POA: Diagnosis not present

## 2021-02-15 DIAGNOSIS — E8581 Light chain (AL) amyloidosis: Secondary | ICD-10-CM | POA: Diagnosis not present

## 2021-02-16 ENCOUNTER — Telehealth: Payer: Self-pay | Admitting: Hematology

## 2021-02-16 NOTE — Telephone Encounter (Signed)
Scheduled follow-up appointments per 5/10 los. Patient is aware. ?

## 2021-03-23 ENCOUNTER — Other Ambulatory Visit: Payer: Self-pay | Admitting: Family

## 2021-03-25 ENCOUNTER — Telehealth: Payer: Self-pay | Admitting: Family

## 2021-03-25 NOTE — Telephone Encounter (Signed)
Please call him to see what his follow up plans are going to be. I have not seen him since 07/2019 and cannot continue to do long term refills for him. He either needs to see me at the [facility] location or get set up with one of the providers at [facility].  We will give #90 of both medications and these will be final refills.

## 2021-03-25 NOTE — Telephone Encounter (Signed)
I have attempted to call pt and there was no answer so I left a message to call back.  

## 2021-03-25 NOTE — Telephone Encounter (Signed)
I have attempted to call pt and gather some information about if he was planing on staying with Mickel Baas and follow to [facility] or stay at [facility]. No answer so I left a message to call back.

## 2021-03-25 NOTE — Telephone Encounter (Signed)
I have given a pt a call and we have scheduled him for a CPE on 03/29/21 @ 10:40. He has confirmed appointment.

## 2021-03-29 ENCOUNTER — Ambulatory Visit (INDEPENDENT_AMBULATORY_CARE_PROVIDER_SITE_OTHER): Payer: BC Managed Care – PPO | Admitting: Family

## 2021-03-29 ENCOUNTER — Other Ambulatory Visit: Payer: Self-pay

## 2021-03-29 ENCOUNTER — Encounter: Payer: Self-pay | Admitting: Family

## 2021-03-29 VITALS — BP 138/80 | HR 67 | Temp 98.2°F | Ht >= 80 in | Wt 233.8 lb

## 2021-03-29 DIAGNOSIS — E785 Hyperlipidemia, unspecified: Secondary | ICD-10-CM | POA: Diagnosis not present

## 2021-03-29 DIAGNOSIS — F419 Anxiety disorder, unspecified: Secondary | ICD-10-CM | POA: Diagnosis not present

## 2021-03-29 DIAGNOSIS — Z0000 Encounter for general adult medical examination without abnormal findings: Secondary | ICD-10-CM | POA: Diagnosis not present

## 2021-03-29 DIAGNOSIS — Z125 Encounter for screening for malignant neoplasm of prostate: Secondary | ICD-10-CM

## 2021-03-29 LAB — PSA: PSA: 1.3 ng/mL (ref 0.10–4.00)

## 2021-03-29 LAB — LIPID PANEL
Cholesterol: 214 mg/dL — ABNORMAL HIGH (ref 0–200)
HDL: 68 mg/dL (ref >39.00)
LDL Cholesterol: 123 mg/dL — ABNORMAL HIGH (ref 0–99)
NonHDL: 145.56
Total CHOL/HDL Ratio: 3
Triglycerides: 115 mg/dL (ref 0.0–149.0)
VLDL: 23 mg/dL (ref 0.0–40.0)

## 2021-03-29 MED ORDER — PAROXETINE HCL ER 12.5 MG PO TB24: ORAL_TABLET | ORAL | 3 refills | Status: DC

## 2021-03-29 MED ORDER — ATORVASTATIN CALCIUM 20 MG PO TABS: ORAL_TABLET | ORAL | 3 refills | Status: DC

## 2021-03-29 MED ORDER — SILDENAFIL CITRATE 100 MG PO TABS: 100.0000 mg | ORAL_TABLET | Freq: Every day | ORAL | 3 refills | Status: DC | PRN
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
## 2021-07-19 ENCOUNTER — Other Ambulatory Visit: Payer: Self-pay

## 2021-07-19 ENCOUNTER — Inpatient Hospital Stay: Payer: BC Managed Care – PPO | Attending: Hematology

## 2021-07-19 DIAGNOSIS — D472 Monoclonal gammopathy: Secondary | ICD-10-CM | POA: Diagnosis not present

## 2021-07-19 LAB — CBC WITH DIFFERENTIAL (CANCER CENTER ONLY)
Abs Immature Granulocytes: 0.01 10*3/uL (ref 0.00–0.07)
Basophils Absolute: 0 10*3/uL (ref 0.0–0.1)
Basophils Relative: 1 %
Eosinophils Absolute: 0.1 10*3/uL (ref 0.0–0.5)
Eosinophils Relative: 3 %
HCT: 43.6 % (ref 39.0–52.0)
Hemoglobin: 15.2 g/dL (ref 13.0–17.0)
Immature Granulocytes: 0 %
Lymphocytes Relative: 21 %
Lymphs Abs: 1.2 10*3/uL (ref 0.7–4.0)
MCH: 30.7 pg (ref 26.0–34.0)
MCHC: 34.9 g/dL (ref 30.0–36.0)
MCV: 88.1 fL (ref 80.0–100.0)
Monocytes Absolute: 0.5 10*3/uL (ref 0.1–1.0)
Monocytes Relative: 9 %
Neutro Abs: 3.7 10*3/uL (ref 1.7–7.7)
Neutrophils Relative %: 66 %
Platelet Count: 202 10*3/uL (ref 150–400)
RBC: 4.95 MIL/uL (ref 4.22–5.81)
RDW: 12.5 % (ref 11.5–15.5)
WBC Count: 5.6 10*3/uL (ref 4.0–10.5)
nRBC: 0 % (ref 0.0–0.2)

## 2021-07-19 LAB — CMP (CANCER CENTER ONLY)
ALT: 28 U/L (ref 0–44)
AST: 24 U/L (ref 15–41)
Albumin: 4.6 g/dL (ref 3.5–5.0)
Alkaline Phosphatase: 52 U/L (ref 38–126)
Anion gap: 9 (ref 5–15)
BUN: 13 mg/dL (ref 8–23)
CO2: 25 mmol/L (ref 22–32)
Calcium: 9.7 mg/dL (ref 8.9–10.3)
Chloride: 105 mmol/L (ref 98–111)
Creatinine: 0.91 mg/dL (ref 0.61–1.24)
GFR, Estimated: >60 mL/min (ref >60)
Glucose, Bld: 100 mg/dL — ABNORMAL HIGH (ref 70–99)
Potassium: 4.1 mmol/L (ref 3.5–5.1)
Sodium: 139 mmol/L (ref 135–145)
Total Bilirubin: 1.1 mg/dL (ref 0.3–1.2)
Total Protein: 7.3 g/dL (ref 6.5–8.1)

## 2021-07-20 LAB — KAPPA/LAMBDA LIGHT CHAINS
Kappa free light chain: 6.6 mg/L (ref 3.3–19.4)
Kappa, lambda light chain ratio: 0.87 (ref 0.26–1.65)
Lambda free light chains: 7.6 mg/L (ref 5.7–26.3)

## 2021-07-25 LAB — MULTIPLE MYELOMA PANEL, SERUM
Albumin SerPl Elph-Mcnc: 4.4 g/dL (ref 2.9–4.4)
Albumin/Glob SerPl: 1.9 — ABNORMAL HIGH (ref 0.7–1.7)
Alpha 1: 0.2 g/dL (ref 0.0–0.4)
Alpha2 Glob SerPl Elph-Mcnc: 0.6 g/dL (ref 0.4–1.0)
B-Globulin SerPl Elph-Mcnc: 0.9 g/dL (ref 0.7–1.3)
Gamma Glob SerPl Elph-Mcnc: 0.8 g/dL (ref 0.4–1.8)
Globulin, Total: 2.4 g/dL (ref 2.2–3.9)
IgA: 85 mg/dL (ref 61–437)
IgG (Immunoglobin G), Serum: 740 mg/dL (ref 603–1613)
IgM (Immunoglobulin M), Srm: 68 mg/dL (ref 20–172)
M Protein SerPl Elph-Mcnc: 0.3 g/dL — ABNORMAL HIGH
Total Protein ELP: 6.8 g/dL (ref 6.0–8.5)

## 2021-08-02 ENCOUNTER — Other Ambulatory Visit: Payer: Self-pay

## 2021-08-02 ENCOUNTER — Inpatient Hospital Stay: Payer: BC Managed Care – PPO | Admitting: Hematology

## 2021-08-02 VITALS — BP 131/84 | HR 82 | Temp 97.9°F | Resp 17 | Ht >= 80 in | Wt 232.9 lb

## 2021-08-02 DIAGNOSIS — D472 Monoclonal gammopathy: Secondary | ICD-10-CM | POA: Diagnosis not present

## 2021-08-02 DIAGNOSIS — E8581 Light chain (AL) amyloidosis: Secondary | ICD-10-CM | POA: Diagnosis not present

## 2021-08-03 ENCOUNTER — Telehealth: Payer: Self-pay | Admitting: Hematology

## 2021-08-03 NOTE — Telephone Encounter (Signed)
Left message with follow-up appointments per 10/25 los. 
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
## 2021-09-06 DIAGNOSIS — M25512 Pain in left shoulder: Secondary | ICD-10-CM | POA: Diagnosis not present

## 2021-09-14 DIAGNOSIS — M25512 Pain in left shoulder: Secondary | ICD-10-CM | POA: Diagnosis not present

## 2021-10-12 DIAGNOSIS — M25512 Pain in left shoulder: Secondary | ICD-10-CM | POA: Diagnosis not present

## 2021-11-01 DIAGNOSIS — M25512 Pain in left shoulder: Secondary | ICD-10-CM | POA: Diagnosis not present

## 2021-11-30 DIAGNOSIS — M25512 Pain in left shoulder: Secondary | ICD-10-CM | POA: Diagnosis not present

## 2021-12-08 DIAGNOSIS — S46011A Strain of muscle(s) and tendon(s) of the rotator cuff of right shoulder, initial encounter: Secondary | ICD-10-CM | POA: Diagnosis not present

## 2021-12-21 DIAGNOSIS — M25611 Stiffness of right shoulder, not elsewhere classified: Secondary | ICD-10-CM | POA: Diagnosis not present

## 2021-12-21 DIAGNOSIS — M25511 Pain in right shoulder: Secondary | ICD-10-CM | POA: Diagnosis not present

## 2021-12-21 DIAGNOSIS — M7581 Other shoulder lesions, right shoulder: Secondary | ICD-10-CM | POA: Diagnosis not present

## 2021-12-21 DIAGNOSIS — M75111 Incomplete rotator cuff tear or rupture of right shoulder, not specified as traumatic: Secondary | ICD-10-CM | POA: Diagnosis not present

## 2021-12-27 DIAGNOSIS — M25511 Pain in right shoulder: Secondary | ICD-10-CM | POA: Diagnosis not present

## 2021-12-27 DIAGNOSIS — M7581 Other shoulder lesions, right shoulder: Secondary | ICD-10-CM | POA: Diagnosis not present

## 2021-12-27 DIAGNOSIS — M75111 Incomplete rotator cuff tear or rupture of right shoulder, not specified as traumatic: Secondary | ICD-10-CM | POA: Diagnosis not present

## 2021-12-27 DIAGNOSIS — M25611 Stiffness of right shoulder, not elsewhere classified: Secondary | ICD-10-CM | POA: Diagnosis not present

## 2021-12-28 ENCOUNTER — Telehealth: Payer: Self-pay

## 2021-12-28 NOTE — Telephone Encounter (Signed)
Christina with [employer] called to see if a letter of competency along with HIPAA form was received on 12/23/21.  She stated she would send another form over today just in case it was not received.  ?

## 2021-12-29 DIAGNOSIS — M75111 Incomplete rotator cuff tear or rupture of right shoulder, not specified as traumatic: Secondary | ICD-10-CM | POA: Diagnosis not present

## 2021-12-29 DIAGNOSIS — M25611 Stiffness of right shoulder, not elsewhere classified: Secondary | ICD-10-CM | POA: Diagnosis not present

## 2021-12-29 DIAGNOSIS — M25511 Pain in right shoulder: Secondary | ICD-10-CM | POA: Diagnosis not present

## 2021-12-29 DIAGNOSIS — M7581 Other shoulder lesions, right shoulder: Secondary | ICD-10-CM | POA: Diagnosis not present

## 2021-12-29 NOTE — Telephone Encounter (Signed)
I will resend it. I thought this form looked familiar  ?

## 2021-12-29 NOTE — Telephone Encounter (Signed)
I have received this paperwork and placed it in the provider folder to be signed.  ?

## 2021-12-30 NOTE — Telephone Encounter (Signed)
I have sent the forms to that fax number with confirmation. I will refax it again. ?

## 2021-12-30 NOTE — Telephone Encounter (Signed)
[employer] stated they have not received the forms yet. They would like for it to be sent to [phone]. ?

## 2022-01-03 DIAGNOSIS — M25611 Stiffness of right shoulder, not elsewhere classified: Secondary | ICD-10-CM | POA: Diagnosis not present

## 2022-01-03 DIAGNOSIS — M75111 Incomplete rotator cuff tear or rupture of right shoulder, not specified as traumatic: Secondary | ICD-10-CM | POA: Diagnosis not present

## 2022-01-03 DIAGNOSIS — M25511 Pain in right shoulder: Secondary | ICD-10-CM | POA: Diagnosis not present

## 2022-01-03 DIAGNOSIS — M7581 Other shoulder lesions, right shoulder: Secondary | ICD-10-CM | POA: Diagnosis not present

## 2022-01-04 DIAGNOSIS — M75121 Complete rotator cuff tear or rupture of right shoulder, not specified as traumatic: Secondary | ICD-10-CM | POA: Diagnosis not present

## 2022-01-06 DIAGNOSIS — M25611 Stiffness of right shoulder, not elsewhere classified: Secondary | ICD-10-CM | POA: Diagnosis not present

## 2022-01-06 DIAGNOSIS — M75111 Incomplete rotator cuff tear or rupture of right shoulder, not specified as traumatic: Secondary | ICD-10-CM | POA: Diagnosis not present

## 2022-01-06 DIAGNOSIS — M25511 Pain in right shoulder: Secondary | ICD-10-CM | POA: Diagnosis not present

## 2022-01-06 DIAGNOSIS — M7581 Other shoulder lesions, right shoulder: Secondary | ICD-10-CM | POA: Diagnosis not present

## 2022-01-10 DIAGNOSIS — M25511 Pain in right shoulder: Secondary | ICD-10-CM | POA: Diagnosis not present

## 2022-01-10 DIAGNOSIS — M25611 Stiffness of right shoulder, not elsewhere classified: Secondary | ICD-10-CM | POA: Diagnosis not present

## 2022-01-10 DIAGNOSIS — M7581 Other shoulder lesions, right shoulder: Secondary | ICD-10-CM | POA: Diagnosis not present

## 2022-01-10 DIAGNOSIS — M75111 Incomplete rotator cuff tear or rupture of right shoulder, not specified as traumatic: Secondary | ICD-10-CM | POA: Diagnosis not present

## 2022-01-12 DIAGNOSIS — M25511 Pain in right shoulder: Secondary | ICD-10-CM | POA: Diagnosis not present

## 2022-01-12 DIAGNOSIS — M75111 Incomplete rotator cuff tear or rupture of right shoulder, not specified as traumatic: Secondary | ICD-10-CM | POA: Diagnosis not present

## 2022-01-12 DIAGNOSIS — M25611 Stiffness of right shoulder, not elsewhere classified: Secondary | ICD-10-CM | POA: Diagnosis not present

## 2022-01-12 DIAGNOSIS — M7581 Other shoulder lesions, right shoulder: Secondary | ICD-10-CM | POA: Diagnosis not present

## 2022-01-17 DIAGNOSIS — M25611 Stiffness of right shoulder, not elsewhere classified: Secondary | ICD-10-CM | POA: Diagnosis not present

## 2022-01-17 DIAGNOSIS — M75111 Incomplete rotator cuff tear or rupture of right shoulder, not specified as traumatic: Secondary | ICD-10-CM | POA: Diagnosis not present

## 2022-01-17 DIAGNOSIS — M7581 Other shoulder lesions, right shoulder: Secondary | ICD-10-CM | POA: Diagnosis not present

## 2022-01-17 DIAGNOSIS — M25511 Pain in right shoulder: Secondary | ICD-10-CM | POA: Diagnosis not present

## 2022-01-19 DIAGNOSIS — M7581 Other shoulder lesions, right shoulder: Secondary | ICD-10-CM | POA: Diagnosis not present

## 2022-01-19 DIAGNOSIS — M25511 Pain in right shoulder: Secondary | ICD-10-CM | POA: Diagnosis not present

## 2022-01-19 DIAGNOSIS — M25611 Stiffness of right shoulder, not elsewhere classified: Secondary | ICD-10-CM | POA: Diagnosis not present

## 2022-01-19 DIAGNOSIS — M75111 Incomplete rotator cuff tear or rupture of right shoulder, not specified as traumatic: Secondary | ICD-10-CM | POA: Diagnosis not present

## 2022-01-24 ENCOUNTER — Inpatient Hospital Stay: Payer: BC Managed Care – PPO | Attending: Hematology

## 2022-01-24 ENCOUNTER — Other Ambulatory Visit: Payer: Self-pay

## 2022-01-24 DIAGNOSIS — M25611 Stiffness of right shoulder, not elsewhere classified: Secondary | ICD-10-CM | POA: Diagnosis not present

## 2022-01-24 DIAGNOSIS — E8581 Light chain (AL) amyloidosis: Secondary | ICD-10-CM

## 2022-01-24 DIAGNOSIS — C9000 Multiple myeloma not having achieved remission: Secondary | ICD-10-CM | POA: Diagnosis not present

## 2022-01-24 DIAGNOSIS — E854 Organ-limited amyloidosis: Secondary | ICD-10-CM | POA: Diagnosis not present

## 2022-01-24 DIAGNOSIS — M25511 Pain in right shoulder: Secondary | ICD-10-CM | POA: Diagnosis not present

## 2022-01-24 DIAGNOSIS — M7581 Other shoulder lesions, right shoulder: Secondary | ICD-10-CM | POA: Diagnosis not present

## 2022-01-24 DIAGNOSIS — M75111 Incomplete rotator cuff tear or rupture of right shoulder, not specified as traumatic: Secondary | ICD-10-CM | POA: Diagnosis not present

## 2022-01-24 DIAGNOSIS — D472 Monoclonal gammopathy: Secondary | ICD-10-CM

## 2022-01-24 LAB — CMP (CANCER CENTER ONLY)
ALT: 23 U/L (ref 0–44)
AST: 20 U/L (ref 15–41)
Albumin: 4.6 g/dL (ref 3.5–5.0)
Alkaline Phosphatase: 40 U/L (ref 38–126)
Anion gap: 7 (ref 5–15)
BUN: 17 mg/dL (ref 8–23)
CO2: 27 mmol/L (ref 22–32)
Calcium: 9.4 mg/dL (ref 8.9–10.3)
Chloride: 106 mmol/L (ref 98–111)
Creatinine: 0.91 mg/dL (ref 0.61–1.24)
GFR, Estimated: >60 mL/min (ref >60)
Glucose, Bld: 103 mg/dL — ABNORMAL HIGH (ref 70–99)
Potassium: 4.1 mmol/L (ref 3.5–5.1)
Sodium: 140 mmol/L (ref 135–145)
Total Bilirubin: 0.8 mg/dL (ref 0.3–1.2)
Total Protein: 7 g/dL (ref 6.5–8.1)

## 2022-01-24 LAB — CBC WITH DIFFERENTIAL/PLATELET
Abs Immature Granulocytes: 0.02 10*3/uL (ref 0.00–0.07)
Basophils Absolute: 0 10*3/uL (ref 0.0–0.1)
Basophils Relative: 0 %
Eosinophils Absolute: 0.2 10*3/uL (ref 0.0–0.5)
Eosinophils Relative: 3 %
HCT: 43.4 % (ref 39.0–52.0)
Hemoglobin: 15.3 g/dL (ref 13.0–17.0)
Immature Granulocytes: 0 %
Lymphocytes Relative: 16 %
Lymphs Abs: 1 10*3/uL (ref 0.7–4.0)
MCH: 30.9 pg (ref 26.0–34.0)
MCHC: 35.3 g/dL (ref 30.0–36.0)
MCV: 87.7 fL (ref 80.0–100.0)
Monocytes Absolute: 0.5 10*3/uL (ref 0.1–1.0)
Monocytes Relative: 8 %
Neutro Abs: 4.6 10*3/uL (ref 1.7–7.7)
Neutrophils Relative %: 73 %
Platelets: 200 10*3/uL (ref 150–400)
RBC: 4.95 MIL/uL (ref 4.22–5.81)
RDW: 12.6 % (ref 11.5–15.5)
WBC: 6.4 10*3/uL (ref 4.0–10.5)
nRBC: 0 % (ref 0.0–0.2)

## 2022-01-25 LAB — KAPPA/LAMBDA LIGHT CHAINS
Kappa free light chain: 5.8 mg/L (ref 3.3–19.4)
Kappa, lambda light chain ratio: 0.84 (ref 0.26–1.65)
Lambda free light chains: 6.9 mg/L (ref 5.7–26.3)

## 2022-01-26 DIAGNOSIS — M7581 Other shoulder lesions, right shoulder: Secondary | ICD-10-CM | POA: Diagnosis not present

## 2022-01-26 DIAGNOSIS — M75111 Incomplete rotator cuff tear or rupture of right shoulder, not specified as traumatic: Secondary | ICD-10-CM | POA: Diagnosis not present

## 2022-01-26 DIAGNOSIS — M25511 Pain in right shoulder: Secondary | ICD-10-CM | POA: Diagnosis not present

## 2022-01-26 DIAGNOSIS — M25611 Stiffness of right shoulder, not elsewhere classified: Secondary | ICD-10-CM | POA: Diagnosis not present

## 2022-01-26 LAB — MULTIPLE MYELOMA PANEL, SERUM
Albumin SerPl Elph-Mcnc: 4.4 g/dL (ref 2.9–4.4)
Albumin/Glob SerPl: 2.1 — ABNORMAL HIGH (ref 0.7–1.7)
Alpha 1: 0.2 g/dL (ref 0.0–0.4)
Alpha2 Glob SerPl Elph-Mcnc: 0.5 g/dL (ref 0.4–1.0)
B-Globulin SerPl Elph-Mcnc: 0.8 g/dL (ref 0.7–1.3)
Gamma Glob SerPl Elph-Mcnc: 0.6 g/dL (ref 0.4–1.8)
Globulin, Total: 2.1 g/dL — ABNORMAL LOW (ref 2.2–3.9)
IgA: 67 mg/dL (ref 61–437)
IgG (Immunoglobin G), Serum: 675 mg/dL (ref 603–1613)
IgM (Immunoglobulin M), Srm: 54 mg/dL (ref 20–172)
M Protein SerPl Elph-Mcnc: 0.3 g/dL — ABNORMAL HIGH
Total Protein ELP: 6.5 g/dL (ref 6.0–8.5)

## 2022-01-31 ENCOUNTER — Inpatient Hospital Stay (HOSPITAL_BASED_OUTPATIENT_CLINIC_OR_DEPARTMENT_OTHER): Payer: BC Managed Care – PPO | Admitting: Hematology

## 2022-01-31 DIAGNOSIS — E8581 Light chain (AL) amyloidosis: Secondary | ICD-10-CM

## 2022-01-31 DIAGNOSIS — C9000 Multiple myeloma not having achieved remission: Secondary | ICD-10-CM | POA: Diagnosis not present

## 2022-01-31 DIAGNOSIS — D472 Monoclonal gammopathy: Secondary | ICD-10-CM

## 2022-01-31 DIAGNOSIS — E854 Organ-limited amyloidosis: Secondary | ICD-10-CM | POA: Diagnosis not present

## 2022-01-31 DIAGNOSIS — M25511 Pain in right shoulder: Secondary | ICD-10-CM | POA: Diagnosis not present

## 2022-01-31 DIAGNOSIS — M7581 Other shoulder lesions, right shoulder: Secondary | ICD-10-CM | POA: Diagnosis not present

## 2022-01-31 DIAGNOSIS — M25611 Stiffness of right shoulder, not elsewhere classified: Secondary | ICD-10-CM | POA: Diagnosis not present

## 2022-01-31 DIAGNOSIS — M75111 Incomplete rotator cuff tear or rupture of right shoulder, not specified as traumatic: Secondary | ICD-10-CM | POA: Diagnosis not present
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
## 2022-02-01 ENCOUNTER — Telehealth: Payer: Self-pay | Admitting: Hematology

## 2022-02-01 NOTE — Telephone Encounter (Signed)
Scheduled follow-up appointments per 4/25 los. Patient is aware. 

## 2022-02-02 DIAGNOSIS — M7581 Other shoulder lesions, right shoulder: Secondary | ICD-10-CM | POA: Diagnosis not present

## 2022-02-02 DIAGNOSIS — M25511 Pain in right shoulder: Secondary | ICD-10-CM | POA: Diagnosis not present

## 2022-02-02 DIAGNOSIS — M75111 Incomplete rotator cuff tear or rupture of right shoulder, not specified as traumatic: Secondary | ICD-10-CM | POA: Diagnosis not present

## 2022-02-02 DIAGNOSIS — M25611 Stiffness of right shoulder, not elsewhere classified: Secondary | ICD-10-CM | POA: Diagnosis not present

## 2022-02-07 DIAGNOSIS — M7581 Other shoulder lesions, right shoulder: Secondary | ICD-10-CM | POA: Diagnosis not present

## 2022-02-07 DIAGNOSIS — M25611 Stiffness of right shoulder, not elsewhere classified: Secondary | ICD-10-CM | POA: Diagnosis not present

## 2022-02-07 DIAGNOSIS — M75111 Incomplete rotator cuff tear or rupture of right shoulder, not specified as traumatic: Secondary | ICD-10-CM | POA: Diagnosis not present

## 2022-02-07 DIAGNOSIS — M25511 Pain in right shoulder: Secondary | ICD-10-CM | POA: Diagnosis not present

## 2022-02-15 DIAGNOSIS — M25611 Stiffness of right shoulder, not elsewhere classified: Secondary | ICD-10-CM | POA: Diagnosis not present

## 2022-02-15 DIAGNOSIS — M7581 Other shoulder lesions, right shoulder: Secondary | ICD-10-CM | POA: Diagnosis not present

## 2022-02-15 DIAGNOSIS — M25511 Pain in right shoulder: Secondary | ICD-10-CM | POA: Diagnosis not present

## 2022-02-15 DIAGNOSIS — M75111 Incomplete rotator cuff tear or rupture of right shoulder, not specified as traumatic: Secondary | ICD-10-CM | POA: Diagnosis not present

## 2022-02-21 DIAGNOSIS — M25611 Stiffness of right shoulder, not elsewhere classified: Secondary | ICD-10-CM | POA: Diagnosis not present

## 2022-02-21 DIAGNOSIS — M25511 Pain in right shoulder: Secondary | ICD-10-CM | POA: Diagnosis not present

## 2022-02-21 DIAGNOSIS — M7581 Other shoulder lesions, right shoulder: Secondary | ICD-10-CM | POA: Diagnosis not present

## 2022-02-21 DIAGNOSIS — M75111 Incomplete rotator cuff tear or rupture of right shoulder, not specified as traumatic: Secondary | ICD-10-CM | POA: Diagnosis not present

## 2022-02-23 DIAGNOSIS — M25611 Stiffness of right shoulder, not elsewhere classified: Secondary | ICD-10-CM | POA: Diagnosis not present

## 2022-02-23 DIAGNOSIS — M7581 Other shoulder lesions, right shoulder: Secondary | ICD-10-CM | POA: Diagnosis not present

## 2022-02-23 DIAGNOSIS — M75111 Incomplete rotator cuff tear or rupture of right shoulder, not specified as traumatic: Secondary | ICD-10-CM | POA: Diagnosis not present

## 2022-02-23 DIAGNOSIS — M25511 Pain in right shoulder: Secondary | ICD-10-CM | POA: Diagnosis not present

## 2022-03-14 DIAGNOSIS — D1801 Hemangioma of skin and subcutaneous tissue: Secondary | ICD-10-CM | POA: Diagnosis not present

## 2022-03-14 DIAGNOSIS — L578 Other skin changes due to chronic exposure to nonionizing radiation: Secondary | ICD-10-CM | POA: Diagnosis not present

## 2022-03-14 DIAGNOSIS — L814 Other melanin hyperpigmentation: Secondary | ICD-10-CM | POA: Diagnosis not present

## 2022-03-14 DIAGNOSIS — L821 Other seborrheic keratosis: Secondary | ICD-10-CM | POA: Diagnosis not present

## 2022-03-15 ENCOUNTER — Other Ambulatory Visit: Payer: Self-pay | Admitting: Family

## 2022-03-15 ENCOUNTER — Encounter: Payer: Self-pay | Admitting: *Deleted

## 2022-03-16 DIAGNOSIS — M24111 Other articular cartilage disorders, right shoulder: Secondary | ICD-10-CM | POA: Diagnosis not present

## 2022-03-16 DIAGNOSIS — S46011A Strain of muscle(s) and tendon(s) of the rotator cuff of right shoulder, initial encounter: Secondary | ICD-10-CM | POA: Diagnosis not present

## 2022-03-16 DIAGNOSIS — M7541 Impingement syndrome of right shoulder: Secondary | ICD-10-CM | POA: Diagnosis not present

## 2022-03-16 DIAGNOSIS — X58XXXA Exposure to other specified factors, initial encounter: Secondary | ICD-10-CM | POA: Diagnosis not present

## 2022-03-16 DIAGNOSIS — G8918 Other acute postprocedural pain: Secondary | ICD-10-CM | POA: Diagnosis not present

## 2022-03-16 DIAGNOSIS — Y999 Unspecified external cause status: Secondary | ICD-10-CM | POA: Diagnosis not present

## 2022-03-22 DIAGNOSIS — M75111 Incomplete rotator cuff tear or rupture of right shoulder, not specified as traumatic: Secondary | ICD-10-CM | POA: Diagnosis not present

## 2022-03-22 DIAGNOSIS — M25511 Pain in right shoulder: Secondary | ICD-10-CM | POA: Diagnosis not present

## 2022-03-22 DIAGNOSIS — M7581 Other shoulder lesions, right shoulder: Secondary | ICD-10-CM | POA: Diagnosis not present

## 2022-03-22 DIAGNOSIS — M25611 Stiffness of right shoulder, not elsewhere classified: Secondary | ICD-10-CM | POA: Diagnosis not present

## 2022-03-23 DIAGNOSIS — M25611 Stiffness of right shoulder, not elsewhere classified: Secondary | ICD-10-CM | POA: Diagnosis not present

## 2022-03-23 DIAGNOSIS — M7581 Other shoulder lesions, right shoulder: Secondary | ICD-10-CM | POA: Diagnosis not present

## 2022-03-23 DIAGNOSIS — M75111 Incomplete rotator cuff tear or rupture of right shoulder, not specified as traumatic: Secondary | ICD-10-CM | POA: Diagnosis not present

## 2022-03-23 DIAGNOSIS — M25511 Pain in right shoulder: Secondary | ICD-10-CM | POA: Diagnosis not present

## 2022-03-24 DIAGNOSIS — M7581 Other shoulder lesions, right shoulder: Secondary | ICD-10-CM | POA: Diagnosis not present

## 2022-03-24 DIAGNOSIS — M25611 Stiffness of right shoulder, not elsewhere classified: Secondary | ICD-10-CM | POA: Diagnosis not present

## 2022-03-24 DIAGNOSIS — M75111 Incomplete rotator cuff tear or rupture of right shoulder, not specified as traumatic: Secondary | ICD-10-CM | POA: Diagnosis not present

## 2022-03-24 DIAGNOSIS — M25511 Pain in right shoulder: Secondary | ICD-10-CM | POA: Diagnosis not present

## 2022-03-28 DIAGNOSIS — M25611 Stiffness of right shoulder, not elsewhere classified: Secondary | ICD-10-CM | POA: Diagnosis not present

## 2022-03-28 DIAGNOSIS — M75111 Incomplete rotator cuff tear or rupture of right shoulder, not specified as traumatic: Secondary | ICD-10-CM | POA: Diagnosis not present

## 2022-03-28 DIAGNOSIS — M25511 Pain in right shoulder: Secondary | ICD-10-CM | POA: Diagnosis not present

## 2022-03-28 DIAGNOSIS — M7581 Other shoulder lesions, right shoulder: Secondary | ICD-10-CM | POA: Diagnosis not present

## 2022-03-30 DIAGNOSIS — M7581 Other shoulder lesions, right shoulder: Secondary | ICD-10-CM | POA: Diagnosis not present

## 2022-03-30 DIAGNOSIS — M25511 Pain in right shoulder: Secondary | ICD-10-CM | POA: Diagnosis not present

## 2022-03-30 DIAGNOSIS — M75111 Incomplete rotator cuff tear or rupture of right shoulder, not specified as traumatic: Secondary | ICD-10-CM | POA: Diagnosis not present

## 2022-03-30 DIAGNOSIS — M25611 Stiffness of right shoulder, not elsewhere classified: Secondary | ICD-10-CM | POA: Diagnosis not present

## 2022-04-04 DIAGNOSIS — M25611 Stiffness of right shoulder, not elsewhere classified: Secondary | ICD-10-CM | POA: Diagnosis not present

## 2022-04-04 DIAGNOSIS — M75111 Incomplete rotator cuff tear or rupture of right shoulder, not specified as traumatic: Secondary | ICD-10-CM | POA: Diagnosis not present

## 2022-04-04 DIAGNOSIS — M25511 Pain in right shoulder: Secondary | ICD-10-CM | POA: Diagnosis not present

## 2022-04-04 DIAGNOSIS — M7581 Other shoulder lesions, right shoulder: Secondary | ICD-10-CM | POA: Diagnosis not present

## 2022-04-06 DIAGNOSIS — M75111 Incomplete rotator cuff tear or rupture of right shoulder, not specified as traumatic: Secondary | ICD-10-CM | POA: Diagnosis not present

## 2022-04-06 DIAGNOSIS — M7581 Other shoulder lesions, right shoulder: Secondary | ICD-10-CM | POA: Diagnosis not present

## 2022-04-06 DIAGNOSIS — M25611 Stiffness of right shoulder, not elsewhere classified: Secondary | ICD-10-CM | POA: Diagnosis not present

## 2022-04-06 DIAGNOSIS — M25511 Pain in right shoulder: Secondary | ICD-10-CM | POA: Diagnosis not present

## 2022-04-12 DIAGNOSIS — M75111 Incomplete rotator cuff tear or rupture of right shoulder, not specified as traumatic: Secondary | ICD-10-CM | POA: Diagnosis not present

## 2022-04-12 DIAGNOSIS — M25511 Pain in right shoulder: Secondary | ICD-10-CM | POA: Diagnosis not present

## 2022-04-12 DIAGNOSIS — M7581 Other shoulder lesions, right shoulder: Secondary | ICD-10-CM | POA: Diagnosis not present

## 2022-04-12 DIAGNOSIS — M25611 Stiffness of right shoulder, not elsewhere classified: Secondary | ICD-10-CM | POA: Diagnosis not present

## 2022-04-13 DIAGNOSIS — M25611 Stiffness of right shoulder, not elsewhere classified: Secondary | ICD-10-CM | POA: Diagnosis not present

## 2022-04-13 DIAGNOSIS — M7581 Other shoulder lesions, right shoulder: Secondary | ICD-10-CM | POA: Diagnosis not present

## 2022-04-13 DIAGNOSIS — M75111 Incomplete rotator cuff tear or rupture of right shoulder, not specified as traumatic: Secondary | ICD-10-CM | POA: Diagnosis not present

## 2022-04-13 DIAGNOSIS — M25511 Pain in right shoulder: Secondary | ICD-10-CM | POA: Diagnosis not present

## 2022-04-20 DIAGNOSIS — M75111 Incomplete rotator cuff tear or rupture of right shoulder, not specified as traumatic: Secondary | ICD-10-CM | POA: Diagnosis not present

## 2022-04-20 DIAGNOSIS — M25611 Stiffness of right shoulder, not elsewhere classified: Secondary | ICD-10-CM | POA: Diagnosis not present

## 2022-04-20 DIAGNOSIS — M7581 Other shoulder lesions, right shoulder: Secondary | ICD-10-CM | POA: Diagnosis not present

## 2022-04-20 DIAGNOSIS — M25511 Pain in right shoulder: Secondary | ICD-10-CM | POA: Diagnosis not present

## 2022-04-25 DIAGNOSIS — M25511 Pain in right shoulder: Secondary | ICD-10-CM | POA: Diagnosis not present

## 2022-04-25 DIAGNOSIS — M75111 Incomplete rotator cuff tear or rupture of right shoulder, not specified as traumatic: Secondary | ICD-10-CM | POA: Diagnosis not present

## 2022-04-25 DIAGNOSIS — M7581 Other shoulder lesions, right shoulder: Secondary | ICD-10-CM | POA: Diagnosis not present

## 2022-04-25 DIAGNOSIS — M25611 Stiffness of right shoulder, not elsewhere classified: Secondary | ICD-10-CM | POA: Diagnosis not present

## 2022-04-27 DIAGNOSIS — M75111 Incomplete rotator cuff tear or rupture of right shoulder, not specified as traumatic: Secondary | ICD-10-CM | POA: Diagnosis not present

## 2022-04-27 DIAGNOSIS — M25511 Pain in right shoulder: Secondary | ICD-10-CM | POA: Diagnosis not present

## 2022-04-27 DIAGNOSIS — M25611 Stiffness of right shoulder, not elsewhere classified: Secondary | ICD-10-CM | POA: Diagnosis not present

## 2022-04-27 DIAGNOSIS — M7581 Other shoulder lesions, right shoulder: Secondary | ICD-10-CM | POA: Diagnosis not present

## 2022-05-09 DIAGNOSIS — M25511 Pain in right shoulder: Secondary | ICD-10-CM | POA: Diagnosis not present

## 2022-05-09 DIAGNOSIS — M75121 Complete rotator cuff tear or rupture of right shoulder, not specified as traumatic: Secondary | ICD-10-CM | POA: Diagnosis not present

## 2022-05-09 DIAGNOSIS — M75111 Incomplete rotator cuff tear or rupture of right shoulder, not specified as traumatic: Secondary | ICD-10-CM | POA: Diagnosis not present

## 2022-05-09 DIAGNOSIS — M7581 Other shoulder lesions, right shoulder: Secondary | ICD-10-CM | POA: Diagnosis not present

## 2022-05-09 DIAGNOSIS — M25611 Stiffness of right shoulder, not elsewhere classified: Secondary | ICD-10-CM | POA: Diagnosis not present

## 2022-05-09 DIAGNOSIS — R278 Other lack of coordination: Secondary | ICD-10-CM | POA: Diagnosis not present

## 2022-05-11 DIAGNOSIS — M7581 Other shoulder lesions, right shoulder: Secondary | ICD-10-CM | POA: Diagnosis not present

## 2022-05-11 DIAGNOSIS — R278 Other lack of coordination: Secondary | ICD-10-CM | POA: Diagnosis not present

## 2022-05-11 DIAGNOSIS — M25511 Pain in right shoulder: Secondary | ICD-10-CM | POA: Diagnosis not present

## 2022-05-11 DIAGNOSIS — M75111 Incomplete rotator cuff tear or rupture of right shoulder, not specified as traumatic: Secondary | ICD-10-CM | POA: Diagnosis not present

## 2022-05-11 DIAGNOSIS — M75121 Complete rotator cuff tear or rupture of right shoulder, not specified as traumatic: Secondary | ICD-10-CM | POA: Diagnosis not present

## 2022-05-11 DIAGNOSIS — M25611 Stiffness of right shoulder, not elsewhere classified: Secondary | ICD-10-CM | POA: Diagnosis not present

## 2022-05-16 DIAGNOSIS — M25611 Stiffness of right shoulder, not elsewhere classified: Secondary | ICD-10-CM | POA: Diagnosis not present

## 2022-05-16 DIAGNOSIS — R278 Other lack of coordination: Secondary | ICD-10-CM | POA: Diagnosis not present

## 2022-05-16 DIAGNOSIS — M75121 Complete rotator cuff tear or rupture of right shoulder, not specified as traumatic: Secondary | ICD-10-CM | POA: Diagnosis not present

## 2022-05-16 DIAGNOSIS — M25511 Pain in right shoulder: Secondary | ICD-10-CM | POA: Diagnosis not present

## 2022-05-23 DIAGNOSIS — M25511 Pain in right shoulder: Secondary | ICD-10-CM | POA: Diagnosis not present

## 2022-05-23 DIAGNOSIS — M75121 Complete rotator cuff tear or rupture of right shoulder, not specified as traumatic: Secondary | ICD-10-CM | POA: Diagnosis not present

## 2022-05-23 DIAGNOSIS — R278 Other lack of coordination: Secondary | ICD-10-CM | POA: Diagnosis not present

## 2022-05-23 DIAGNOSIS — M25611 Stiffness of right shoulder, not elsewhere classified: Secondary | ICD-10-CM | POA: Diagnosis not present

## 2022-05-25 DIAGNOSIS — M75121 Complete rotator cuff tear or rupture of right shoulder, not specified as traumatic: Secondary | ICD-10-CM | POA: Diagnosis not present

## 2022-05-25 DIAGNOSIS — M25611 Stiffness of right shoulder, not elsewhere classified: Secondary | ICD-10-CM | POA: Diagnosis not present

## 2022-05-25 DIAGNOSIS — R278 Other lack of coordination: Secondary | ICD-10-CM | POA: Diagnosis not present

## 2022-05-25 DIAGNOSIS — M25511 Pain in right shoulder: Secondary | ICD-10-CM | POA: Diagnosis not present

## 2022-05-30 DIAGNOSIS — M75121 Complete rotator cuff tear or rupture of right shoulder, not specified as traumatic: Secondary | ICD-10-CM | POA: Diagnosis not present

## 2022-05-30 DIAGNOSIS — M25611 Stiffness of right shoulder, not elsewhere classified: Secondary | ICD-10-CM | POA: Diagnosis not present

## 2022-05-30 DIAGNOSIS — R278 Other lack of coordination: Secondary | ICD-10-CM | POA: Diagnosis not present

## 2022-05-30 DIAGNOSIS — M25511 Pain in right shoulder: Secondary | ICD-10-CM | POA: Diagnosis not present

## 2022-06-14 ENCOUNTER — Ambulatory Visit (INDEPENDENT_AMBULATORY_CARE_PROVIDER_SITE_OTHER): Payer: Medicare Other | Admitting: Family Medicine

## 2022-06-14 ENCOUNTER — Encounter: Payer: Self-pay | Admitting: Family Medicine

## 2022-06-14 VITALS — BP 142/90 | HR 70 | Temp 98.5°F | Wt 234.6 lb

## 2022-06-14 DIAGNOSIS — D171 Benign lipomatous neoplasm of skin and subcutaneous tissue of trunk: Secondary | ICD-10-CM | POA: Diagnosis not present

## 2022-06-14 DIAGNOSIS — W109XXA Fall (on) (from) unspecified stairs and steps, initial encounter: Secondary | ICD-10-CM | POA: Diagnosis not present
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
## 2022-06-15 DIAGNOSIS — R278 Other lack of coordination: Secondary | ICD-10-CM | POA: Diagnosis not present

## 2022-06-15 DIAGNOSIS — M75121 Complete rotator cuff tear or rupture of right shoulder, not specified as traumatic: Secondary | ICD-10-CM | POA: Diagnosis not present

## 2022-06-15 DIAGNOSIS — M25511 Pain in right shoulder: Secondary | ICD-10-CM | POA: Diagnosis not present

## 2022-06-15 DIAGNOSIS — M25611 Stiffness of right shoulder, not elsewhere classified: Secondary | ICD-10-CM | POA: Diagnosis not present

## 2022-06-20 DIAGNOSIS — R278 Other lack of coordination: Secondary | ICD-10-CM | POA: Diagnosis not present

## 2022-06-20 DIAGNOSIS — M75121 Complete rotator cuff tear or rupture of right shoulder, not specified as traumatic: Secondary | ICD-10-CM | POA: Diagnosis not present

## 2022-06-20 DIAGNOSIS — M25511 Pain in right shoulder: Secondary | ICD-10-CM | POA: Diagnosis not present

## 2022-06-20 DIAGNOSIS — M25611 Stiffness of right shoulder, not elsewhere classified: Secondary | ICD-10-CM | POA: Diagnosis not present

## 2022-06-22 DIAGNOSIS — M25511 Pain in right shoulder: Secondary | ICD-10-CM | POA: Diagnosis not present

## 2022-06-22 DIAGNOSIS — M75121 Complete rotator cuff tear or rupture of right shoulder, not specified as traumatic: Secondary | ICD-10-CM | POA: Diagnosis not present

## 2022-06-22 DIAGNOSIS — R278 Other lack of coordination: Secondary | ICD-10-CM | POA: Diagnosis not present

## 2022-06-22 DIAGNOSIS — M25611 Stiffness of right shoulder, not elsewhere classified: Secondary | ICD-10-CM | POA: Diagnosis not present

## 2022-06-27 DIAGNOSIS — M25511 Pain in right shoulder: Secondary | ICD-10-CM | POA: Diagnosis not present

## 2022-06-27 DIAGNOSIS — M75121 Complete rotator cuff tear or rupture of right shoulder, not specified as traumatic: Secondary | ICD-10-CM | POA: Diagnosis not present

## 2022-06-27 DIAGNOSIS — M25611 Stiffness of right shoulder, not elsewhere classified: Secondary | ICD-10-CM | POA: Diagnosis not present

## 2022-06-27 DIAGNOSIS — R278 Other lack of coordination: Secondary | ICD-10-CM | POA: Diagnosis not present

## 2022-06-29 DIAGNOSIS — M25511 Pain in right shoulder: Secondary | ICD-10-CM | POA: Diagnosis not present

## 2022-06-29 DIAGNOSIS — R278 Other lack of coordination: Secondary | ICD-10-CM | POA: Diagnosis not present

## 2022-06-29 DIAGNOSIS — M75121 Complete rotator cuff tear or rupture of right shoulder, not specified as traumatic: Secondary | ICD-10-CM | POA: Diagnosis not present

## 2022-06-29 DIAGNOSIS — M25611 Stiffness of right shoulder, not elsewhere classified: Secondary | ICD-10-CM | POA: Diagnosis not present

## 2022-06-29 DIAGNOSIS — H524 Presbyopia: Secondary | ICD-10-CM | POA: Diagnosis not present

## 2022-07-04 DIAGNOSIS — M75121 Complete rotator cuff tear or rupture of right shoulder, not specified as traumatic: Secondary | ICD-10-CM | POA: Diagnosis not present

## 2022-07-04 DIAGNOSIS — M25611 Stiffness of right shoulder, not elsewhere classified: Secondary | ICD-10-CM | POA: Diagnosis not present

## 2022-07-04 DIAGNOSIS — R278 Other lack of coordination: Secondary | ICD-10-CM | POA: Diagnosis not present

## 2022-07-04 DIAGNOSIS — M25511 Pain in right shoulder: Secondary | ICD-10-CM | POA: Diagnosis not present

## 2022-07-18 DIAGNOSIS — M25511 Pain in right shoulder: Secondary | ICD-10-CM | POA: Diagnosis not present

## 2022-07-18 DIAGNOSIS — M25611 Stiffness of right shoulder, not elsewhere classified: Secondary | ICD-10-CM | POA: Diagnosis not present

## 2022-07-18 DIAGNOSIS — M75121 Complete rotator cuff tear or rupture of right shoulder, not specified as traumatic: Secondary | ICD-10-CM | POA: Diagnosis not present

## 2022-07-18 DIAGNOSIS — R278 Other lack of coordination: Secondary | ICD-10-CM | POA: Diagnosis not present

## 2022-07-20 DIAGNOSIS — M25611 Stiffness of right shoulder, not elsewhere classified: Secondary | ICD-10-CM | POA: Diagnosis not present

## 2022-07-20 DIAGNOSIS — R278 Other lack of coordination: Secondary | ICD-10-CM | POA: Diagnosis not present

## 2022-07-20 DIAGNOSIS — M25511 Pain in right shoulder: Secondary | ICD-10-CM | POA: Diagnosis not present

## 2022-07-20 DIAGNOSIS — M75121 Complete rotator cuff tear or rupture of right shoulder, not specified as traumatic: Secondary | ICD-10-CM | POA: Diagnosis not present

## 2022-07-25 ENCOUNTER — Other Ambulatory Visit: Payer: Self-pay

## 2022-07-25 DIAGNOSIS — R278 Other lack of coordination: Secondary | ICD-10-CM | POA: Diagnosis not present

## 2022-07-25 DIAGNOSIS — D472 Monoclonal gammopathy: Secondary | ICD-10-CM

## 2022-07-25 DIAGNOSIS — M75121 Complete rotator cuff tear or rupture of right shoulder, not specified as traumatic: Secondary | ICD-10-CM | POA: Diagnosis not present

## 2022-07-25 DIAGNOSIS — M25511 Pain in right shoulder: Secondary | ICD-10-CM | POA: Diagnosis not present

## 2022-07-25 DIAGNOSIS — M25611 Stiffness of right shoulder, not elsewhere classified: Secondary | ICD-10-CM | POA: Diagnosis not present

## 2022-07-26 ENCOUNTER — Inpatient Hospital Stay: Payer: Medicare Other | Attending: Hematology

## 2022-07-26 DIAGNOSIS — Z79899 Other long term (current) drug therapy: Secondary | ICD-10-CM | POA: Insufficient documentation

## 2022-07-26 DIAGNOSIS — E854 Organ-limited amyloidosis: Secondary | ICD-10-CM | POA: Diagnosis not present

## 2022-07-26 DIAGNOSIS — C9000 Multiple myeloma not having achieved remission: Secondary | ICD-10-CM | POA: Diagnosis not present

## 2022-07-26 DIAGNOSIS — Z09 Encounter for follow-up examination after completed treatment for conditions other than malignant neoplasm: Secondary | ICD-10-CM | POA: Diagnosis not present

## 2022-07-26 DIAGNOSIS — D472 Monoclonal gammopathy: Secondary | ICD-10-CM

## 2022-07-26 LAB — CBC WITH DIFFERENTIAL (CANCER CENTER ONLY)
Abs Immature Granulocytes: 0.02 10*3/uL (ref 0.00–0.07)
Basophils Absolute: 0 10*3/uL (ref 0.0–0.1)
Basophils Relative: 1 %
Eosinophils Absolute: 0.2 10*3/uL (ref 0.0–0.5)
Eosinophils Relative: 4 %
HCT: 45.2 % (ref 39.0–52.0)
Hemoglobin: 15.9 g/dL (ref 13.0–17.0)
Immature Granulocytes: 0 %
Lymphocytes Relative: 20 %
Lymphs Abs: 1 10*3/uL (ref 0.7–4.0)
MCH: 31 pg (ref 26.0–34.0)
MCHC: 35.2 g/dL (ref 30.0–36.0)
MCV: 88.1 fL (ref 80.0–100.0)
Monocytes Absolute: 0.5 10*3/uL (ref 0.1–1.0)
Monocytes Relative: 9 %
Neutro Abs: 3.4 10*3/uL (ref 1.7–7.7)
Neutrophils Relative %: 66 %
Platelet Count: 246 10*3/uL (ref 150–400)
RBC: 5.13 MIL/uL (ref 4.22–5.81)
RDW: 12.7 % (ref 11.5–15.5)
WBC Count: 5.2 10*3/uL (ref 4.0–10.5)
nRBC: 0 % (ref 0.0–0.2)

## 2022-07-26 LAB — CMP (CANCER CENTER ONLY)
ALT: 31 U/L (ref 0–44)
AST: 20 U/L (ref 15–41)
Albumin: 4.9 g/dL (ref 3.5–5.0)
Alkaline Phosphatase: 54 U/L (ref 38–126)
Anion gap: 5 (ref 5–15)
BUN: 14 mg/dL (ref 8–23)
CO2: 29 mmol/L (ref 22–32)
Calcium: 9.8 mg/dL (ref 8.9–10.3)
Chloride: 106 mmol/L (ref 98–111)
Creatinine: 0.87 mg/dL (ref 0.61–1.24)
GFR, Estimated: >60 mL/min (ref >60)
Glucose, Bld: 96 mg/dL (ref 70–99)
Potassium: 4.3 mmol/L (ref 3.5–5.1)
Sodium: 140 mmol/L (ref 135–145)
Total Bilirubin: 0.9 mg/dL (ref 0.3–1.2)
Total Protein: 7.4 g/dL (ref 6.5–8.1)

## 2022-07-27 DIAGNOSIS — R278 Other lack of coordination: Secondary | ICD-10-CM | POA: Diagnosis not present

## 2022-07-27 DIAGNOSIS — M75121 Complete rotator cuff tear or rupture of right shoulder, not specified as traumatic: Secondary | ICD-10-CM | POA: Diagnosis not present

## 2022-07-27 DIAGNOSIS — M25611 Stiffness of right shoulder, not elsewhere classified: Secondary | ICD-10-CM | POA: Diagnosis not present

## 2022-07-27 DIAGNOSIS — M25511 Pain in right shoulder: Secondary | ICD-10-CM | POA: Diagnosis not present

## 2022-07-27 LAB — KAPPA/LAMBDA LIGHT CHAINS
Kappa free light chain: 6.5 mg/L (ref 3.3–19.4)
Kappa, lambda light chain ratio: 0.68 (ref 0.26–1.65)
Lambda free light chains: 9.5 mg/L (ref 5.7–26.3)

## 2022-08-01 DIAGNOSIS — R278 Other lack of coordination: Secondary | ICD-10-CM | POA: Diagnosis not present

## 2022-08-01 DIAGNOSIS — M25511 Pain in right shoulder: Secondary | ICD-10-CM | POA: Diagnosis not present

## 2022-08-01 DIAGNOSIS — M75121 Complete rotator cuff tear or rupture of right shoulder, not specified as traumatic: Secondary | ICD-10-CM | POA: Diagnosis not present

## 2022-08-01 DIAGNOSIS — M25611 Stiffness of right shoulder, not elsewhere classified: Secondary | ICD-10-CM | POA: Diagnosis not present

## 2022-08-01 LAB — MULTIPLE MYELOMA PANEL, SERUM
Albumin SerPl Elph-Mcnc: 4.3 g/dL (ref 2.9–4.4)
Albumin/Glob SerPl: 1.8 — ABNORMAL HIGH (ref 0.7–1.7)
Alpha 1: 0.2 g/dL (ref 0.0–0.4)
Alpha2 Glob SerPl Elph-Mcnc: 0.6 g/dL (ref 0.4–1.0)
B-Globulin SerPl Elph-Mcnc: 0.9 g/dL (ref 0.7–1.3)
Gamma Glob SerPl Elph-Mcnc: 0.7 g/dL (ref 0.4–1.8)
Globulin, Total: 2.4 g/dL (ref 2.2–3.9)
IgA: 69 mg/dL (ref 61–437)
IgG (Immunoglobin G), Serum: 657 mg/dL (ref 603–1613)
IgM (Immunoglobulin M), Srm: 54 mg/dL (ref 20–172)
M Protein SerPl Elph-Mcnc: 0.3 g/dL — ABNORMAL HIGH
Total Protein ELP: 6.7 g/dL (ref 6.0–8.5)

## 2022-08-02 ENCOUNTER — Inpatient Hospital Stay (HOSPITAL_BASED_OUTPATIENT_CLINIC_OR_DEPARTMENT_OTHER): Payer: Medicare Other | Admitting: Hematology

## 2022-08-02 ENCOUNTER — Encounter: Payer: Self-pay | Admitting: Hematology

## 2022-08-02 DIAGNOSIS — C9000 Multiple myeloma not having achieved remission: Secondary | ICD-10-CM | POA: Diagnosis not present

## 2022-08-02 DIAGNOSIS — E8581 Light chain (AL) amyloidosis: Secondary | ICD-10-CM | POA: Diagnosis not present

## 2022-08-02 DIAGNOSIS — Z79899 Other long term (current) drug therapy: Secondary | ICD-10-CM | POA: Diagnosis not present

## 2022-08-02 DIAGNOSIS — E854 Organ-limited amyloidosis: Secondary | ICD-10-CM | POA: Diagnosis not present

## 2022-08-02 DIAGNOSIS — D472 Monoclonal gammopathy: Secondary | ICD-10-CM
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
## 2022-08-03 DIAGNOSIS — M25611 Stiffness of right shoulder, not elsewhere classified: Secondary | ICD-10-CM | POA: Diagnosis not present

## 2022-08-03 DIAGNOSIS — M75121 Complete rotator cuff tear or rupture of right shoulder, not specified as traumatic: Secondary | ICD-10-CM | POA: Diagnosis not present

## 2022-08-03 DIAGNOSIS — M25511 Pain in right shoulder: Secondary | ICD-10-CM | POA: Diagnosis not present

## 2022-08-03 DIAGNOSIS — R278 Other lack of coordination: Secondary | ICD-10-CM | POA: Diagnosis not present

## 2022-08-08 DIAGNOSIS — M25611 Stiffness of right shoulder, not elsewhere classified: Secondary | ICD-10-CM | POA: Diagnosis not present

## 2022-08-08 DIAGNOSIS — M75121 Complete rotator cuff tear or rupture of right shoulder, not specified as traumatic: Secondary | ICD-10-CM | POA: Diagnosis not present

## 2022-08-08 DIAGNOSIS — M25511 Pain in right shoulder: Secondary | ICD-10-CM | POA: Diagnosis not present

## 2022-08-08 DIAGNOSIS — R278 Other lack of coordination: Secondary | ICD-10-CM | POA: Diagnosis not present

## 2022-08-15 DIAGNOSIS — R278 Other lack of coordination: Secondary | ICD-10-CM | POA: Diagnosis not present

## 2022-08-15 DIAGNOSIS — M25611 Stiffness of right shoulder, not elsewhere classified: Secondary | ICD-10-CM | POA: Diagnosis not present

## 2022-08-15 DIAGNOSIS — M75121 Complete rotator cuff tear or rupture of right shoulder, not specified as traumatic: Secondary | ICD-10-CM | POA: Diagnosis not present

## 2022-08-15 DIAGNOSIS — M25511 Pain in right shoulder: Secondary | ICD-10-CM | POA: Diagnosis not present

## 2022-08-17 DIAGNOSIS — M75121 Complete rotator cuff tear or rupture of right shoulder, not specified as traumatic: Secondary | ICD-10-CM | POA: Diagnosis not present

## 2022-08-17 DIAGNOSIS — M25611 Stiffness of right shoulder, not elsewhere classified: Secondary | ICD-10-CM | POA: Diagnosis not present

## 2022-08-17 DIAGNOSIS — M25511 Pain in right shoulder: Secondary | ICD-10-CM | POA: Diagnosis not present

## 2022-08-17 DIAGNOSIS — R278 Other lack of coordination: Secondary | ICD-10-CM | POA: Diagnosis not present

## 2022-08-22 DIAGNOSIS — M25511 Pain in right shoulder: Secondary | ICD-10-CM | POA: Diagnosis not present

## 2022-08-22 DIAGNOSIS — M75121 Complete rotator cuff tear or rupture of right shoulder, not specified as traumatic: Secondary | ICD-10-CM | POA: Diagnosis not present

## 2022-08-22 DIAGNOSIS — R278 Other lack of coordination: Secondary | ICD-10-CM | POA: Diagnosis not present

## 2022-08-22 DIAGNOSIS — M25611 Stiffness of right shoulder, not elsewhere classified: Secondary | ICD-10-CM | POA: Diagnosis not present

## 2022-08-24 DIAGNOSIS — M75121 Complete rotator cuff tear or rupture of right shoulder, not specified as traumatic: Secondary | ICD-10-CM | POA: Diagnosis not present

## 2022-08-24 DIAGNOSIS — R278 Other lack of coordination: Secondary | ICD-10-CM | POA: Diagnosis not present

## 2022-08-24 DIAGNOSIS — M25511 Pain in right shoulder: Secondary | ICD-10-CM | POA: Diagnosis not present

## 2022-08-24 DIAGNOSIS — M25611 Stiffness of right shoulder, not elsewhere classified: Secondary | ICD-10-CM | POA: Diagnosis not present

## 2022-09-06 DIAGNOSIS — M25511 Pain in right shoulder: Secondary | ICD-10-CM | POA: Diagnosis not present

## 2022-09-07 DIAGNOSIS — M25511 Pain in right shoulder: Secondary | ICD-10-CM | POA: Diagnosis not present

## 2022-09-07 DIAGNOSIS — M75121 Complete rotator cuff tear or rupture of right shoulder, not specified as traumatic: Secondary | ICD-10-CM | POA: Diagnosis not present

## 2022-09-07 DIAGNOSIS — M25611 Stiffness of right shoulder, not elsewhere classified: Secondary | ICD-10-CM | POA: Diagnosis not present

## 2022-09-07 DIAGNOSIS — R278 Other lack of coordination: Secondary | ICD-10-CM | POA: Diagnosis not present

## 2022-09-14 DIAGNOSIS — M75121 Complete rotator cuff tear or rupture of right shoulder, not specified as traumatic: Secondary | ICD-10-CM | POA: Diagnosis not present

## 2022-09-14 DIAGNOSIS — M25611 Stiffness of right shoulder, not elsewhere classified: Secondary | ICD-10-CM | POA: Diagnosis not present

## 2022-09-14 DIAGNOSIS — M25511 Pain in right shoulder: Secondary | ICD-10-CM | POA: Diagnosis not present

## 2022-09-14 DIAGNOSIS — R278 Other lack of coordination: Secondary | ICD-10-CM | POA: Diagnosis not present

## 2022-09-21 DIAGNOSIS — M25511 Pain in right shoulder: Secondary | ICD-10-CM | POA: Diagnosis not present

## 2022-09-21 DIAGNOSIS — M25611 Stiffness of right shoulder, not elsewhere classified: Secondary | ICD-10-CM | POA: Diagnosis not present

## 2022-09-21 DIAGNOSIS — M75121 Complete rotator cuff tear or rupture of right shoulder, not specified as traumatic: Secondary | ICD-10-CM | POA: Diagnosis not present

## 2022-09-21 DIAGNOSIS — R278 Other lack of coordination: Secondary | ICD-10-CM | POA: Diagnosis not present

## 2022-09-28 DIAGNOSIS — M25511 Pain in right shoulder: Secondary | ICD-10-CM | POA: Diagnosis not present

## 2022-09-28 DIAGNOSIS — M25611 Stiffness of right shoulder, not elsewhere classified: Secondary | ICD-10-CM | POA: Diagnosis not present

## 2022-09-28 DIAGNOSIS — M75121 Complete rotator cuff tear or rupture of right shoulder, not specified as traumatic: Secondary | ICD-10-CM | POA: Diagnosis not present

## 2022-09-28 DIAGNOSIS — R278 Other lack of coordination: Secondary | ICD-10-CM | POA: Diagnosis not present

## 2022-10-12 DIAGNOSIS — R278 Other lack of coordination: Secondary | ICD-10-CM | POA: Diagnosis not present

## 2022-10-12 DIAGNOSIS — M25611 Stiffness of right shoulder, not elsewhere classified: Secondary | ICD-10-CM | POA: Diagnosis not present

## 2022-10-12 DIAGNOSIS — M25511 Pain in right shoulder: Secondary | ICD-10-CM | POA: Diagnosis not present

## 2022-10-12 DIAGNOSIS — M75121 Complete rotator cuff tear or rupture of right shoulder, not specified as traumatic: Secondary | ICD-10-CM | POA: Diagnosis not present

## 2022-10-19 DIAGNOSIS — R278 Other lack of coordination: Secondary | ICD-10-CM | POA: Diagnosis not present

## 2022-10-19 DIAGNOSIS — M25611 Stiffness of right shoulder, not elsewhere classified: Secondary | ICD-10-CM | POA: Diagnosis not present

## 2022-10-19 DIAGNOSIS — M75121 Complete rotator cuff tear or rupture of right shoulder, not specified as traumatic: Secondary | ICD-10-CM | POA: Diagnosis not present

## 2022-10-19 DIAGNOSIS — M25511 Pain in right shoulder: Secondary | ICD-10-CM | POA: Diagnosis not present

## 2022-11-02 DIAGNOSIS — M25511 Pain in right shoulder: Secondary | ICD-10-CM | POA: Diagnosis not present

## 2022-11-02 DIAGNOSIS — R278 Other lack of coordination: Secondary | ICD-10-CM | POA: Diagnosis not present

## 2022-11-02 DIAGNOSIS — M75121 Complete rotator cuff tear or rupture of right shoulder, not specified as traumatic: Secondary | ICD-10-CM | POA: Diagnosis not present

## 2022-11-02 DIAGNOSIS — M25611 Stiffness of right shoulder, not elsewhere classified: Secondary | ICD-10-CM | POA: Diagnosis not present

## 2022-11-16 DIAGNOSIS — M25511 Pain in right shoulder: Secondary | ICD-10-CM | POA: Diagnosis not present

## 2022-11-16 DIAGNOSIS — R278 Other lack of coordination: Secondary | ICD-10-CM | POA: Diagnosis not present

## 2022-11-16 DIAGNOSIS — M75121 Complete rotator cuff tear or rupture of right shoulder, not specified as traumatic: Secondary | ICD-10-CM | POA: Diagnosis not present

## 2022-11-16 DIAGNOSIS — M25611 Stiffness of right shoulder, not elsewhere classified: Secondary | ICD-10-CM | POA: Diagnosis not present

## 2022-11-30 DIAGNOSIS — R278 Other lack of coordination: Secondary | ICD-10-CM | POA: Diagnosis not present

## 2022-11-30 DIAGNOSIS — M25511 Pain in right shoulder: Secondary | ICD-10-CM | POA: Diagnosis not present

## 2022-11-30 DIAGNOSIS — M75121 Complete rotator cuff tear or rupture of right shoulder, not specified as traumatic: Secondary | ICD-10-CM | POA: Diagnosis not present

## 2022-11-30 DIAGNOSIS — M25611 Stiffness of right shoulder, not elsewhere classified: Secondary | ICD-10-CM | POA: Diagnosis not present

## 2022-12-26 ENCOUNTER — Ambulatory Visit (INDEPENDENT_AMBULATORY_CARE_PROVIDER_SITE_OTHER): Payer: Medicare Other | Admitting: Family

## 2022-12-26 ENCOUNTER — Encounter: Payer: Self-pay | Admitting: Family

## 2022-12-26 VITALS — BP 130/76 | HR 67 | Resp 18 | Ht >= 80 in | Wt 239.8 lb

## 2022-12-26 DIAGNOSIS — Z23 Encounter for immunization: Secondary | ICD-10-CM | POA: Diagnosis not present

## 2022-12-26 DIAGNOSIS — Z9889 Other specified postprocedural states: Secondary | ICD-10-CM | POA: Insufficient documentation

## 2022-12-26 DIAGNOSIS — C9000 Multiple myeloma not having achieved remission: Secondary | ICD-10-CM | POA: Diagnosis not present

## 2022-12-26 DIAGNOSIS — E8581 Light chain (AL) amyloidosis: Secondary | ICD-10-CM | POA: Diagnosis not present

## 2022-12-26 DIAGNOSIS — E785 Hyperlipidemia, unspecified: Secondary | ICD-10-CM

## 2022-12-26 DIAGNOSIS — Z125 Encounter for screening for malignant neoplasm of prostate: Secondary | ICD-10-CM

## 2022-12-26 LAB — CBC WITH DIFFERENTIAL/PLATELET
Basophils Absolute: 0 10*3/uL (ref 0.0–0.1)
Basophils Relative: 0.3 % (ref 0.0–3.0)
Eosinophils Absolute: 0.2 10*3/uL (ref 0.0–0.7)
Eosinophils Relative: 3.6 % (ref 0.0–5.0)
HCT: 45.6 % (ref 39.0–52.0)
Hemoglobin: 15.8 g/dL (ref 13.0–17.0)
Lymphocytes Relative: 19.2 % (ref 12.0–46.0)
Lymphs Abs: 1 10*3/uL (ref 0.7–4.0)
MCHC: 34.5 g/dL (ref 30.0–36.0)
MCV: 90.6 fl (ref 78.0–100.0)
Monocytes Absolute: 0.6 10*3/uL (ref 0.1–1.0)
Monocytes Relative: 10.4 % (ref 3.0–12.0)
Neutro Abs: 3.6 10*3/uL (ref 1.4–7.7)
Neutrophils Relative %: 66.5 % (ref 43.0–77.0)
Platelets: 239 10*3/uL (ref 150.0–400.0)
RBC: 5.04 Mil/uL (ref 4.22–5.81)
RDW: 13.8 % (ref 11.5–15.5)
WBC: 5.5 10*3/uL (ref 4.0–10.5)

## 2022-12-26 LAB — COMPREHENSIVE METABOLIC PANEL
ALT: 26 U/L (ref 0–53)
AST: 20 U/L (ref 0–37)
Albumin: 4.7 g/dL (ref 3.5–5.2)
Alkaline Phosphatase: 50 U/L (ref 39–117)
BUN: 16 mg/dL (ref 6–23)
CO2: 27 mEq/L (ref 19–32)
Calcium: 10.1 mg/dL (ref 8.4–10.5)
Chloride: 105 mEq/L (ref 96–112)
Creatinine, Ser: 0.87 mg/dL (ref 0.40–1.50)
GFR: 90.51 mL/min (ref >60.00)
Glucose, Bld: 100 mg/dL — ABNORMAL HIGH (ref 70–99)
Potassium: 5.1 mEq/L (ref 3.5–5.1)
Sodium: 141 mEq/L (ref 135–145)
Total Bilirubin: 0.9 mg/dL (ref 0.2–1.2)
Total Protein: 7 g/dL (ref 6.0–8.3)

## 2022-12-26 LAB — LIPID PANEL
Cholesterol: 224 mg/dL — ABNORMAL HIGH (ref 0–200)
HDL: 63.2 mg/dL (ref >39.00)
LDL Cholesterol: 139 mg/dL — ABNORMAL HIGH (ref 0–99)
NonHDL: 161.27
Total CHOL/HDL Ratio: 4
Triglycerides: 111 mg/dL (ref 0.0–149.0)
VLDL: 22.2 mg/dL (ref 0.0–40.0)

## 2022-12-26 LAB — PSA: PSA: 2.67 ng/mL (ref 0.10–4.00)

## 2022-12-26 MED ORDER — SILDENAFIL CITRATE 100 MG PO TABS: 100.0000 mg | ORAL_TABLET | Freq: Every day | ORAL | 3 refills | Status: DC | PRN
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
## 2022-12-26 NOTE — Patient Instructions (Addendum)
Please plan to get your Shingrix ( Shingles vaccine) and your Tdap through your pharmacy; we will get your Pneumonia shot updated;   If you want to purchase a home blood pressure cuff, consider arm cuff/ [employer] brand; if consistently elevated above 140/90, please let us know;

## 2022-12-28 DIAGNOSIS — M25611 Stiffness of right shoulder, not elsewhere classified: Secondary | ICD-10-CM | POA: Diagnosis not present

## 2022-12-28 DIAGNOSIS — R278 Other lack of coordination: Secondary | ICD-10-CM | POA: Diagnosis not present

## 2022-12-28 DIAGNOSIS — M75121 Complete rotator cuff tear or rupture of right shoulder, not specified as traumatic: Secondary | ICD-10-CM | POA: Diagnosis not present

## 2022-12-28 DIAGNOSIS — M25511 Pain in right shoulder: Secondary | ICD-10-CM | POA: Diagnosis not present

## 2023-01-04 DIAGNOSIS — M75121 Complete rotator cuff tear or rupture of right shoulder, not specified as traumatic: Secondary | ICD-10-CM | POA: Diagnosis not present

## 2023-01-04 DIAGNOSIS — R278 Other lack of coordination: Secondary | ICD-10-CM | POA: Diagnosis not present

## 2023-01-04 DIAGNOSIS — M25511 Pain in right shoulder: Secondary | ICD-10-CM | POA: Diagnosis not present

## 2023-01-04 DIAGNOSIS — M25611 Stiffness of right shoulder, not elsewhere classified: Secondary | ICD-10-CM | POA: Diagnosis not present

## 2023-01-11 DIAGNOSIS — M25611 Stiffness of right shoulder, not elsewhere classified: Secondary | ICD-10-CM | POA: Diagnosis not present

## 2023-01-11 DIAGNOSIS — R278 Other lack of coordination: Secondary | ICD-10-CM | POA: Diagnosis not present

## 2023-01-11 DIAGNOSIS — M75121 Complete rotator cuff tear or rupture of right shoulder, not specified as traumatic: Secondary | ICD-10-CM | POA: Diagnosis not present

## 2023-01-11 DIAGNOSIS — M25511 Pain in right shoulder: Secondary | ICD-10-CM | POA: Diagnosis not present

## 2023-01-22 ENCOUNTER — Other Ambulatory Visit: Payer: Self-pay

## 2023-01-22 DIAGNOSIS — D472 Monoclonal gammopathy: Secondary | ICD-10-CM

## 2023-01-23 ENCOUNTER — Inpatient Hospital Stay: Payer: Medicare Other | Attending: Nurse Practitioner

## 2023-01-23 DIAGNOSIS — E854 Organ-limited amyloidosis: Secondary | ICD-10-CM | POA: Diagnosis not present

## 2023-01-23 DIAGNOSIS — C9000 Multiple myeloma not having achieved remission: Secondary | ICD-10-CM | POA: Insufficient documentation

## 2023-01-23 DIAGNOSIS — D472 Monoclonal gammopathy: Secondary | ICD-10-CM

## 2023-01-23 DIAGNOSIS — Z79899 Other long term (current) drug therapy: Secondary | ICD-10-CM | POA: Diagnosis not present

## 2023-01-23 LAB — CBC WITH DIFFERENTIAL (CANCER CENTER ONLY)
Abs Immature Granulocytes: 0.01 10*3/uL (ref 0.00–0.07)
Basophils Absolute: 0 10*3/uL (ref 0.0–0.1)
Basophils Relative: 1 %
Eosinophils Absolute: 0.3 10*3/uL (ref 0.0–0.5)
Eosinophils Relative: 5 %
HCT: 43.2 % (ref 39.0–52.0)
Hemoglobin: 15.1 g/dL (ref 13.0–17.0)
Immature Granulocytes: 0 %
Lymphocytes Relative: 18 %
Lymphs Abs: 1 10*3/uL (ref 0.7–4.0)
MCH: 31 pg (ref 26.0–34.0)
MCHC: 35 g/dL (ref 30.0–36.0)
MCV: 88.7 fL (ref 80.0–100.0)
Monocytes Absolute: 0.6 10*3/uL (ref 0.1–1.0)
Monocytes Relative: 10 %
Neutro Abs: 3.9 10*3/uL (ref 1.7–7.7)
Neutrophils Relative %: 66 %
Platelet Count: 224 10*3/uL (ref 150–400)
RBC: 4.87 MIL/uL (ref 4.22–5.81)
RDW: 12.3 % (ref 11.5–15.5)
WBC Count: 5.8 10*3/uL (ref 4.0–10.5)
nRBC: 0 % (ref 0.0–0.2)

## 2023-01-23 LAB — CMP (CANCER CENTER ONLY)
ALT: 34 U/L (ref 0–44)
AST: 20 U/L (ref 15–41)
Albumin: 4.9 g/dL (ref 3.5–5.0)
Alkaline Phosphatase: 48 U/L (ref 38–126)
Anion gap: 6 (ref 5–15)
BUN: 16 mg/dL (ref 8–23)
CO2: 28 mmol/L (ref 22–32)
Calcium: 9.6 mg/dL (ref 8.9–10.3)
Chloride: 106 mmol/L (ref 98–111)
Creatinine: 0.94 mg/dL (ref 0.61–1.24)
GFR, Estimated: >60 mL/min (ref >60)
Glucose, Bld: 101 mg/dL — ABNORMAL HIGH (ref 70–99)
Potassium: 3.9 mmol/L (ref 3.5–5.1)
Sodium: 140 mmol/L (ref 135–145)
Total Bilirubin: 0.7 mg/dL (ref 0.3–1.2)
Total Protein: 7.4 g/dL (ref 6.5–8.1)

## 2023-01-24 LAB — KAPPA/LAMBDA LIGHT CHAINS
Kappa free light chain: 6.3 mg/L (ref 3.3–19.4)
Kappa, lambda light chain ratio: 0.68 (ref 0.26–1.65)
Lambda free light chains: 9.2 mg/L (ref 5.7–26.3)

## 2023-01-25 DIAGNOSIS — M75121 Complete rotator cuff tear or rupture of right shoulder, not specified as traumatic: Secondary | ICD-10-CM | POA: Diagnosis not present

## 2023-01-25 DIAGNOSIS — M25511 Pain in right shoulder: Secondary | ICD-10-CM | POA: Diagnosis not present

## 2023-01-25 DIAGNOSIS — M25611 Stiffness of right shoulder, not elsewhere classified: Secondary | ICD-10-CM | POA: Diagnosis not present

## 2023-01-25 DIAGNOSIS — R278 Other lack of coordination: Secondary | ICD-10-CM | POA: Diagnosis not present

## 2023-01-26 LAB — MULTIPLE MYELOMA PANEL, SERUM
Albumin SerPl Elph-Mcnc: 4.3 g/dL (ref 2.9–4.4)
Albumin/Glob SerPl: 1.8 — ABNORMAL HIGH (ref 0.7–1.7)
Alpha 1: 0.2 g/dL (ref 0.0–0.4)
Alpha2 Glob SerPl Elph-Mcnc: 0.6 g/dL (ref 0.4–1.0)
B-Globulin SerPl Elph-Mcnc: 0.9 g/dL (ref 0.7–1.3)
Gamma Glob SerPl Elph-Mcnc: 0.7 g/dL (ref 0.4–1.8)
Globulin, Total: 2.5 g/dL (ref 2.2–3.9)
IgA: 86 mg/dL (ref 61–437)
IgG (Immunoglobin G), Serum: 719 mg/dL (ref 603–1613)
IgM (Immunoglobulin M), Srm: 61 mg/dL (ref 20–172)
M Protein SerPl Elph-Mcnc: 0.3 g/dL — ABNORMAL HIGH
Total Protein ELP: 6.8 g/dL (ref 6.0–8.5)

## 2023-01-30 ENCOUNTER — Encounter: Payer: Self-pay | Admitting: Hematology

## 2023-01-30 ENCOUNTER — Inpatient Hospital Stay (HOSPITAL_BASED_OUTPATIENT_CLINIC_OR_DEPARTMENT_OTHER): Payer: Medicare Other | Admitting: Hematology

## 2023-01-30 DIAGNOSIS — C9000 Multiple myeloma not having achieved remission: Secondary | ICD-10-CM | POA: Diagnosis not present

## 2023-01-30 DIAGNOSIS — E8581 Light chain (AL) amyloidosis: Secondary | ICD-10-CM

## 2023-01-30 DIAGNOSIS — D472 Monoclonal gammopathy: Secondary | ICD-10-CM | POA: Diagnosis not present

## 2023-01-30 DIAGNOSIS — Z79899 Other long term (current) drug therapy: Secondary | ICD-10-CM | POA: Diagnosis not present

## 2023-01-30 DIAGNOSIS — E854 Organ-limited amyloidosis: Secondary | ICD-10-CM | POA: Diagnosis not present
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
## 2023-01-31 DIAGNOSIS — K080 Exfoliation of teeth due to systemic causes: Secondary | ICD-10-CM | POA: Diagnosis not present

## 2023-02-01 DIAGNOSIS — R278 Other lack of coordination: Secondary | ICD-10-CM | POA: Diagnosis not present

## 2023-02-01 DIAGNOSIS — M25511 Pain in right shoulder: Secondary | ICD-10-CM | POA: Diagnosis not present

## 2023-02-01 DIAGNOSIS — M75121 Complete rotator cuff tear or rupture of right shoulder, not specified as traumatic: Secondary | ICD-10-CM | POA: Diagnosis not present

## 2023-02-01 DIAGNOSIS — M25611 Stiffness of right shoulder, not elsewhere classified: Secondary | ICD-10-CM | POA: Diagnosis not present

## 2023-02-13 ENCOUNTER — Telehealth: Payer: Self-pay | Admitting: Hematology

## 2023-02-22 DIAGNOSIS — M75121 Complete rotator cuff tear or rupture of right shoulder, not specified as traumatic: Secondary | ICD-10-CM | POA: Diagnosis not present

## 2023-02-22 DIAGNOSIS — M25511 Pain in right shoulder: Secondary | ICD-10-CM | POA: Diagnosis not present

## 2023-02-22 DIAGNOSIS — M25611 Stiffness of right shoulder, not elsewhere classified: Secondary | ICD-10-CM | POA: Diagnosis not present

## 2023-02-22 DIAGNOSIS — R278 Other lack of coordination: Secondary | ICD-10-CM | POA: Diagnosis not present

## 2023-03-01 DIAGNOSIS — R278 Other lack of coordination: Secondary | ICD-10-CM | POA: Diagnosis not present

## 2023-03-01 DIAGNOSIS — M25611 Stiffness of right shoulder, not elsewhere classified: Secondary | ICD-10-CM | POA: Diagnosis not present

## 2023-03-01 DIAGNOSIS — M25511 Pain in right shoulder: Secondary | ICD-10-CM | POA: Diagnosis not present

## 2023-03-01 DIAGNOSIS — M75121 Complete rotator cuff tear or rupture of right shoulder, not specified as traumatic: Secondary | ICD-10-CM | POA: Diagnosis not present

## 2023-03-02 IMAGING — CT NM PET IMAGE RESTAGE (PS) WHOLE BODY
1 of 7 series · 3 of 25 positions shown · non-contrast
Comparison: PET-CT 05/13/2018

CLINICAL DATA: Subsequent treatment strategy for multiple myeloma.
Restaging.

EXAM:
NUCLEAR MEDICINE PET WHOLE BODY
TECHNIQUE: 11.8 mCi F-18 FDG was injected intravenously. Full-ring PET imaging
was performed from the head to foot after the radiotracer. CT data
was obtained and used for attenuation correction and anatomic
localization.
Fasting blood glucose: 107 mg/dl

[Series 4: ct wb 5.0 bf37 · axial · 5.0mm · 0.98mm/px · z∈[+506,+1542]mm · 3 of 489 slices shown]
[im 123/489  soft-tissue]
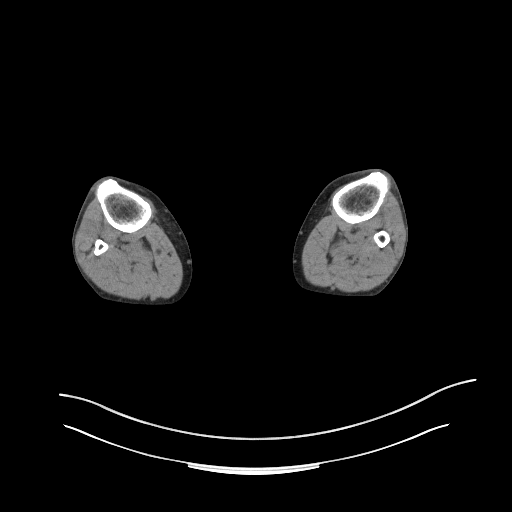
[im 245/489  soft-tissue]
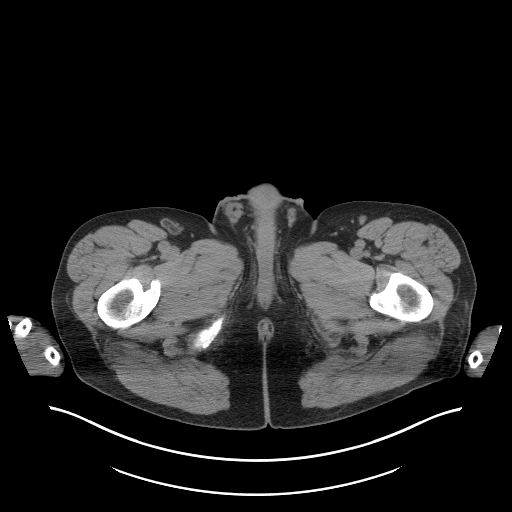
[im 367/489  soft-tissue]
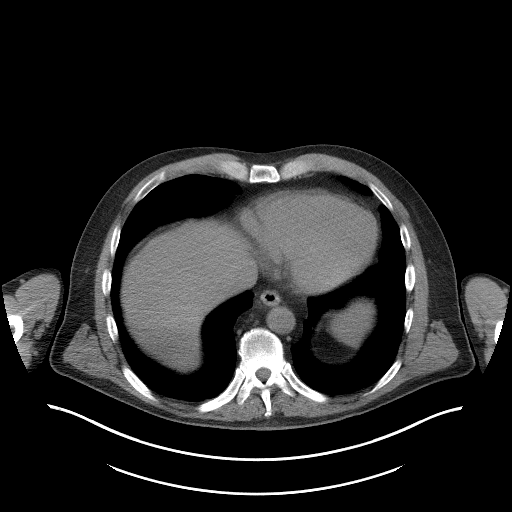

[3 of 25 positions shown; findings below may reference images not displayed]

FINDINGS: Mediastinal blood pool activity: SUV max

HEAD/NECK: No hypermetabolic activity in the scalp. No
hypermetabolic cervical lymph nodes.

Incidental CT findings: none

CHEST: No hypermetabolic mediastinal or hilar nodes. No suspicious
pulmonary nodules on the CT scan.

Incidental CT findings: none

ABDOMEN/PELVIS: No abnormal hypermetabolic activity within the
liver, pancreas, adrenal glands, or spleen. No hypermetabolic lymph
nodes in the abdomen or pelvis.

Incidental CT findings: none

SKELETON: No focal hypermetabolic activity to suggest skeletal
metastasis.

Incidental CT findings: No lytic or expansile lesions.

EXTREMITIES: No abnormal hypermetabolic activity in the lower
extremities.

Incidental CT findings: No lytic or expansile lesion
IMPRESSION: 1. No evidence of active multiple myeloma FDG on whole-body PET
scan.
2. No lytic or blastic lesion identified on CT portion exam.
3. No plasmacytoma.

## 2023-03-08 DIAGNOSIS — M75121 Complete rotator cuff tear or rupture of right shoulder, not specified as traumatic: Secondary | ICD-10-CM | POA: Diagnosis not present

## 2023-03-08 DIAGNOSIS — M25611 Stiffness of right shoulder, not elsewhere classified: Secondary | ICD-10-CM | POA: Diagnosis not present

## 2023-03-08 DIAGNOSIS — R278 Other lack of coordination: Secondary | ICD-10-CM | POA: Diagnosis not present

## 2023-03-08 DIAGNOSIS — M25511 Pain in right shoulder: Secondary | ICD-10-CM | POA: Diagnosis not present

## 2023-03-13 ENCOUNTER — Encounter: Payer: Self-pay | Admitting: Family

## 2023-03-21 ENCOUNTER — Other Ambulatory Visit: Payer: Self-pay | Admitting: Family

## 2023-03-21 DIAGNOSIS — M7020 Olecranon bursitis, unspecified elbow: Secondary | ICD-10-CM

## 2023-03-22 ENCOUNTER — Other Ambulatory Visit: Payer: Self-pay | Admitting: Family

## 2023-03-22 DIAGNOSIS — I861 Scrotal varices: Secondary | ICD-10-CM

## 2023-03-26 ENCOUNTER — Ambulatory Visit (INDEPENDENT_AMBULATORY_CARE_PROVIDER_SITE_OTHER): Payer: Medicare Other | Admitting: Student

## 2023-03-26 ENCOUNTER — Encounter (HOSPITAL_BASED_OUTPATIENT_CLINIC_OR_DEPARTMENT_OTHER): Payer: Self-pay | Admitting: Student

## 2023-03-26 DIAGNOSIS — M25522 Pain in left elbow: Secondary | ICD-10-CM

## 2023-03-26 DIAGNOSIS — M7022 Olecranon bursitis, left elbow: Secondary | ICD-10-CM

## 2023-03-27 DIAGNOSIS — M7022 Olecranon bursitis, left elbow: Secondary | ICD-10-CM

## 2023-03-27 MED ORDER — LIDOCAINE HCL 1 % IJ SOLN
1.0000 mL | INTRAMUSCULAR | Status: AC | PRN
Start: 2023-03-27 — End: 2023-03-27
  Administered 2023-03-27: 1 mL

## 2023-03-27 MED ORDER — TRIAMCINOLONE ACETONIDE 40 MG/ML IJ SUSP
1.0000 mL | INTRAMUSCULAR | Status: AC | PRN
Start: 2023-03-27 — End: 2023-03-27
  Administered 2023-03-27: 1 mL via INTRA_ARTICULAR
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
## 2023-04-05 ENCOUNTER — Ambulatory Visit: Payer: Medicare Other | Admitting: Urology

## 2023-04-05 ENCOUNTER — Encounter: Payer: Self-pay | Admitting: Urology

## 2023-04-05 VITALS — BP 138/67 | HR 62 | Ht >= 80 in | Wt 232.0 lb

## 2023-04-05 DIAGNOSIS — N5089 Other specified disorders of the male genital organs: Secondary | ICD-10-CM | POA: Diagnosis not present
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
## 2023-04-10 ENCOUNTER — Ambulatory Visit (HOSPITAL_BASED_OUTPATIENT_CLINIC_OR_DEPARTMENT_OTHER)
Admission: RE | Admit: 2023-04-10 | Discharge: 2023-04-10 | Disposition: A | Discharge status: Home / Self Care | Payer: Medicare Other | Source: Ambulatory Visit | Attending: Urology | Admitting: Urology

## 2023-04-10 DIAGNOSIS — N5089 Other specified disorders of the male genital organs: Secondary | ICD-10-CM | POA: Diagnosis not present

## 2023-04-18 ENCOUNTER — Encounter: Payer: Self-pay | Admitting: Urology

## 2023-05-24 DIAGNOSIS — R278 Other lack of coordination: Secondary | ICD-10-CM | POA: Diagnosis not present

## 2023-05-24 DIAGNOSIS — M75121 Complete rotator cuff tear or rupture of right shoulder, not specified as traumatic: Secondary | ICD-10-CM | POA: Diagnosis not present

## 2023-05-24 DIAGNOSIS — M25611 Stiffness of right shoulder, not elsewhere classified: Secondary | ICD-10-CM | POA: Diagnosis not present

## 2023-05-24 DIAGNOSIS — M25511 Pain in right shoulder: Secondary | ICD-10-CM | POA: Diagnosis not present

## 2023-05-28 DIAGNOSIS — M25611 Stiffness of right shoulder, not elsewhere classified: Secondary | ICD-10-CM | POA: Diagnosis not present

## 2023-05-28 DIAGNOSIS — M25511 Pain in right shoulder: Secondary | ICD-10-CM | POA: Diagnosis not present

## 2023-05-28 DIAGNOSIS — R278 Other lack of coordination: Secondary | ICD-10-CM | POA: Diagnosis not present

## 2023-05-28 DIAGNOSIS — M75121 Complete rotator cuff tear or rupture of right shoulder, not specified as traumatic: Secondary | ICD-10-CM | POA: Diagnosis not present

## 2023-06-01 DIAGNOSIS — M75121 Complete rotator cuff tear or rupture of right shoulder, not specified as traumatic: Secondary | ICD-10-CM | POA: Diagnosis not present

## 2023-06-01 DIAGNOSIS — M25611 Stiffness of right shoulder, not elsewhere classified: Secondary | ICD-10-CM | POA: Diagnosis not present

## 2023-06-01 DIAGNOSIS — M25511 Pain in right shoulder: Secondary | ICD-10-CM | POA: Diagnosis not present

## 2023-06-01 DIAGNOSIS — R278 Other lack of coordination: Secondary | ICD-10-CM | POA: Diagnosis not present

## 2023-06-20 DIAGNOSIS — M4722 Other spondylosis with radiculopathy, cervical region: Secondary | ICD-10-CM | POA: Diagnosis not present

## 2023-06-29 DIAGNOSIS — R200 Anesthesia of skin: Secondary | ICD-10-CM | POA: Diagnosis not present

## 2023-06-29 DIAGNOSIS — M47812 Spondylosis without myelopathy or radiculopathy, cervical region: Secondary | ICD-10-CM | POA: Diagnosis not present

## 2023-06-29 DIAGNOSIS — M50221 Other cervical disc displacement at C4-C5 level: Secondary | ICD-10-CM | POA: Diagnosis not present

## 2023-06-29 DIAGNOSIS — M542 Cervicalgia: Secondary | ICD-10-CM | POA: Diagnosis not present

## 2023-06-29 DIAGNOSIS — M4802 Spinal stenosis, cervical region: Secondary | ICD-10-CM | POA: Diagnosis not present

## 2023-07-16 DIAGNOSIS — M5441 Lumbago with sciatica, right side: Secondary | ICD-10-CM | POA: Diagnosis not present

## 2023-07-17 ENCOUNTER — Other Ambulatory Visit: Payer: Self-pay | Admitting: *Deleted

## 2023-07-17 DIAGNOSIS — D472 Monoclonal gammopathy: Secondary | ICD-10-CM

## 2023-07-18 ENCOUNTER — Inpatient Hospital Stay: Payer: Medicare Other | Attending: Internal Medicine

## 2023-07-18 DIAGNOSIS — E8581 Light chain (AL) amyloidosis: Secondary | ICD-10-CM | POA: Diagnosis not present

## 2023-07-18 DIAGNOSIS — C9000 Multiple myeloma not having achieved remission: Secondary | ICD-10-CM | POA: Diagnosis not present

## 2023-07-18 DIAGNOSIS — D472 Monoclonal gammopathy: Secondary | ICD-10-CM

## 2023-07-18 LAB — CBC WITH DIFFERENTIAL (CANCER CENTER ONLY)
Abs Immature Granulocytes: 0.01 10*3/uL (ref 0.00–0.07)
Basophils Absolute: 0 10*3/uL (ref 0.0–0.1)
Basophils Relative: 1 %
Eosinophils Absolute: 0.2 10*3/uL (ref 0.0–0.5)
Eosinophils Relative: 3 %
HCT: 42.3 % (ref 39.0–52.0)
Hemoglobin: 15.3 g/dL (ref 13.0–17.0)
Immature Granulocytes: 0 %
Lymphocytes Relative: 18 %
Lymphs Abs: 1 10*3/uL (ref 0.7–4.0)
MCH: 31.9 pg (ref 26.0–34.0)
MCHC: 36.2 g/dL — ABNORMAL HIGH (ref 30.0–36.0)
MCV: 88.3 fL (ref 80.0–100.0)
Monocytes Absolute: 0.5 10*3/uL (ref 0.1–1.0)
Monocytes Relative: 9 %
Neutro Abs: 3.9 10*3/uL (ref 1.7–7.7)
Neutrophils Relative %: 69 %
Platelet Count: 210 10*3/uL (ref 150–400)
RBC: 4.79 MIL/uL (ref 4.22–5.81)
RDW: 12.7 % (ref 11.5–15.5)
WBC Count: 5.5 10*3/uL (ref 4.0–10.5)
nRBC: 0 % (ref 0.0–0.2)

## 2023-07-18 LAB — CMP (CANCER CENTER ONLY)
ALT: 27 U/L (ref 0–44)
AST: 20 U/L (ref 15–41)
Albumin: 4.8 g/dL (ref 3.5–5.0)
Alkaline Phosphatase: 46 U/L (ref 38–126)
Anion gap: 6 (ref 5–15)
BUN: 16 mg/dL (ref 8–23)
CO2: 30 mmol/L (ref 22–32)
Calcium: 9.8 mg/dL (ref 8.9–10.3)
Chloride: 105 mmol/L (ref 98–111)
Creatinine: 1.06 mg/dL (ref 0.61–1.24)
GFR, Estimated: >60 mL/min (ref >60)
Glucose, Bld: 104 mg/dL — ABNORMAL HIGH (ref 70–99)
Potassium: 4.2 mmol/L (ref 3.5–5.1)
Sodium: 141 mmol/L (ref 135–145)
Total Bilirubin: 0.8 mg/dL (ref 0.3–1.2)
Total Protein: 7.3 g/dL (ref 6.5–8.1)

## 2023-07-19 LAB — KAPPA/LAMBDA LIGHT CHAINS
Kappa free light chain: 5.8 mg/L (ref 3.3–19.4)
Kappa, lambda light chain ratio: 0.76 (ref 0.26–1.65)
Lambda free light chains: 7.6 mg/L (ref 5.7–26.3)

## 2023-07-23 LAB — MULTIPLE MYELOMA PANEL, SERUM
Albumin SerPl Elph-Mcnc: 4.5 g/dL — ABNORMAL HIGH (ref 2.9–4.4)
Albumin/Glob SerPl: 1.9 — ABNORMAL HIGH (ref 0.7–1.7)
Alpha 1: 0.2 g/dL (ref 0.0–0.4)
Alpha2 Glob SerPl Elph-Mcnc: 0.6 g/dL (ref 0.4–1.0)
B-Globulin SerPl Elph-Mcnc: 1 g/dL (ref 0.7–1.3)
Gamma Glob SerPl Elph-Mcnc: 0.7 g/dL (ref 0.4–1.8)
Globulin, Total: 2.4 g/dL (ref 2.2–3.9)
IgA: 88 mg/dL (ref 61–437)
IgG (Immunoglobin G), Serum: 768 mg/dL (ref 603–1613)
IgM (Immunoglobulin M), Srm: 66 mg/dL (ref 20–172)
M Protein SerPl Elph-Mcnc: 0.4 g/dL — ABNORMAL HIGH
Total Protein ELP: 6.9 g/dL (ref 6.0–8.5)

## 2023-08-01 ENCOUNTER — Inpatient Hospital Stay: Payer: Medicare Other | Admitting: Hematology

## 2023-08-01 DIAGNOSIS — C9000 Multiple myeloma not having achieved remission: Secondary | ICD-10-CM | POA: Diagnosis not present

## 2023-08-01 DIAGNOSIS — D472 Monoclonal gammopathy: Secondary | ICD-10-CM

## 2023-08-01 DIAGNOSIS — E8581 Light chain (AL) amyloidosis: Secondary | ICD-10-CM
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
## 2023-08-03 ENCOUNTER — Telehealth: Payer: Self-pay | Admitting: Hematology

## 2023-08-03 NOTE — Telephone Encounter (Signed)
Per Candise Che 10/24 los patient is aware of scheduled appointment times/dates

## 2023-08-06 DIAGNOSIS — M5412 Radiculopathy, cervical region: Secondary | ICD-10-CM | POA: Diagnosis not present

## 2023-08-08 DIAGNOSIS — K080 Exfoliation of teeth due to systemic causes: Secondary | ICD-10-CM | POA: Diagnosis not present

## 2023-09-04 ENCOUNTER — Encounter: Payer: Self-pay | Admitting: Family

## 2023-09-04 ENCOUNTER — Other Ambulatory Visit: Payer: Self-pay | Admitting: Family

## 2023-09-04 MED ORDER — ATORVASTATIN CALCIUM 20 MG PO TABS: ORAL_TABLET | ORAL | 3 refills | Status: DC

## 2023-09-18 ENCOUNTER — Ambulatory Visit: Payer: Medicare Other | Admitting: Family

## 2023-09-18 ENCOUNTER — Encounter: Payer: Self-pay | Admitting: Family

## 2023-09-18 ENCOUNTER — Encounter: Payer: Medicare Other | Admitting: Family

## 2023-09-18 VITALS — BP 136/82 | HR 73 | Resp 17 | Ht >= 80 in | Wt 243.4 lb

## 2023-09-18 DIAGNOSIS — E785 Hyperlipidemia, unspecified: Secondary | ICD-10-CM

## 2023-09-18 DIAGNOSIS — Z0000 Encounter for general adult medical examination without abnormal findings: Secondary | ICD-10-CM | POA: Diagnosis not present

## 2023-09-18 DIAGNOSIS — M542 Cervicalgia: Secondary | ICD-10-CM | POA: Diagnosis not present

## 2023-09-18 MED ORDER — PREDNISONE 20 MG PO TABS
20.0000 mg | ORAL_TABLET | Freq: Every day | ORAL | 0 refills | Status: DC
Start: 2023-09-18 — End: 2024-01-08

## 2023-09-18 MED ORDER — ATORVASTATIN CALCIUM 20 MG PO TABS: ORAL_TABLET | ORAL | 1 refills | Status: AC
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
## 2023-09-21 NOTE — Care Plan (Signed)
I corrected as requested

## 2023-09-24 ENCOUNTER — Encounter: Payer: Self-pay | Admitting: Family

## 2023-11-01 ENCOUNTER — Encounter: Payer: Self-pay | Admitting: Family

## 2023-11-05 ENCOUNTER — Ambulatory Visit (HOSPITAL_BASED_OUTPATIENT_CLINIC_OR_DEPARTMENT_OTHER): Payer: Medicare Other | Admitting: Student

## 2023-11-05 DIAGNOSIS — M7022 Olecranon bursitis, left elbow: Secondary | ICD-10-CM | POA: Diagnosis not present

## 2023-11-05 DIAGNOSIS — M79602 Pain in left arm: Secondary | ICD-10-CM | POA: Diagnosis not present
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
## 2023-11-20 ENCOUNTER — Ambulatory Visit (HOSPITAL_BASED_OUTPATIENT_CLINIC_OR_DEPARTMENT_OTHER): Payer: Medicare Other | Admitting: Student

## 2023-11-20 DIAGNOSIS — M7022 Olecranon bursitis, left elbow: Secondary | ICD-10-CM

## 2023-11-20 DIAGNOSIS — M79602 Pain in left arm: Secondary | ICD-10-CM

## 2023-11-20 MED ORDER — TRIAMCINOLONE ACETONIDE 40 MG/ML IJ SUSP
2.0000 mL | INTRAMUSCULAR | Status: AC | PRN
  Administered 2023-11-20: 2 mL via INTRA_ARTICULAR

## 2023-11-20 MED ORDER — LIDOCAINE HCL 1 % IJ SOLN
2.0000 mL | INTRAMUSCULAR | Status: AC | PRN
  Administered 2023-11-20: 2 mL
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
## 2024-01-04 ENCOUNTER — Other Ambulatory Visit

## 2024-01-04 ENCOUNTER — Encounter: Payer: Self-pay | Admitting: Family

## 2024-01-04 DIAGNOSIS — E785 Hyperlipidemia, unspecified: Secondary | ICD-10-CM

## 2024-01-04 LAB — COMPREHENSIVE METABOLIC PANEL WITH GFR
ALT: 34 U/L (ref 0–53)
AST: 18 U/L (ref 0–37)
Albumin: 4.8 g/dL (ref 3.5–5.2)
Alkaline Phosphatase: 45 U/L (ref 39–117)
BUN: 17 mg/dL (ref 6–23)
CO2: 30 meq/L (ref 19–32)
Calcium: 9.7 mg/dL (ref 8.4–10.5)
Chloride: 103 meq/L (ref 96–112)
Creatinine, Ser: 0.8 mg/dL (ref 0.40–1.50)
GFR: 92.17 mL/min (ref >60.00)
Glucose, Bld: 92 mg/dL (ref 70–99)
Potassium: 4.2 meq/L (ref 3.5–5.1)
Sodium: 140 meq/L (ref 135–145)
Total Bilirubin: 0.8 mg/dL (ref 0.2–1.2)
Total Protein: 7.3 g/dL (ref 6.0–8.3)

## 2024-01-04 LAB — LIPID PANEL
Cholesterol: 228 mg/dL — ABNORMAL HIGH (ref 0–200)
HDL: 70.4 mg/dL (ref >39.00)
LDL Cholesterol: 137 mg/dL — ABNORMAL HIGH (ref 0–99)
NonHDL: 158.03
Total CHOL/HDL Ratio: 3
Triglycerides: 106 mg/dL (ref 0.0–149.0)
VLDL: 21.2 mg/dL (ref 0.0–40.0)

## 2024-01-08 ENCOUNTER — Encounter: Payer: Self-pay | Admitting: Family

## 2024-01-08 ENCOUNTER — Other Ambulatory Visit: Payer: Self-pay | Admitting: Family

## 2024-01-08 ENCOUNTER — Ambulatory Visit (INDEPENDENT_AMBULATORY_CARE_PROVIDER_SITE_OTHER): Admitting: Family

## 2024-01-08 ENCOUNTER — Other Ambulatory Visit (HOSPITAL_BASED_OUTPATIENT_CLINIC_OR_DEPARTMENT_OTHER): Payer: Self-pay

## 2024-01-08 VITALS — BP 138/64 | HR 74 | Ht >= 80 in | Wt 238.8 lb

## 2024-01-08 DIAGNOSIS — Z125 Encounter for screening for malignant neoplasm of prostate: Secondary | ICD-10-CM

## 2024-01-08 DIAGNOSIS — J209 Acute bronchitis, unspecified: Secondary | ICD-10-CM

## 2024-01-08 MED ORDER — PREDNISONE 20 MG PO TABS
20.0000 mg | ORAL_TABLET | Freq: Every day | ORAL | 0 refills | Status: DC
  Filled 2024-01-08: qty 5, 5d supply, fill #0

## 2024-01-08 MED ORDER — AZITHROMYCIN 250 MG PO TABS
ORAL_TABLET | ORAL | 0 refills | Status: DC
  Filled 2024-01-08: qty 6, 5d supply, fill #0
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
## 2024-01-09 ENCOUNTER — Encounter: Payer: Self-pay | Admitting: Family

## 2024-01-09 LAB — PSA: PSA: 2.8 ng/mL (ref 0.10–4.00)

## 2024-01-29 ENCOUNTER — Other Ambulatory Visit: Payer: Self-pay | Admitting: Family

## 2024-01-29 MED ORDER — SILDENAFIL CITRATE 100 MG PO TABS: 100.0000 mg | ORAL_TABLET | Freq: Every day | ORAL | 3 refills | Status: DC | PRN

## 2024-02-12 DIAGNOSIS — K080 Exfoliation of teeth due to systemic causes: Secondary | ICD-10-CM | POA: Diagnosis not present

## 2024-03-04 DIAGNOSIS — H524 Presbyopia: Secondary | ICD-10-CM | POA: Diagnosis not present

## 2024-03-12 DIAGNOSIS — K080 Exfoliation of teeth due to systemic causes: Secondary | ICD-10-CM | POA: Diagnosis not present

## 2024-05-12 ENCOUNTER — Other Ambulatory Visit (HOSPITAL_BASED_OUTPATIENT_CLINIC_OR_DEPARTMENT_OTHER): Payer: Self-pay

## 2024-07-22 DIAGNOSIS — D045 Carcinoma in situ of skin of trunk: Secondary | ICD-10-CM | POA: Diagnosis not present

## 2024-07-22 DIAGNOSIS — D485 Neoplasm of uncertain behavior of skin: Secondary | ICD-10-CM | POA: Diagnosis not present

## 2024-07-29 ENCOUNTER — Other Ambulatory Visit: Payer: Self-pay

## 2024-07-29 ENCOUNTER — Inpatient Hospital Stay: Attending: Hematology

## 2024-07-29 DIAGNOSIS — D045 Carcinoma in situ of skin of trunk: Secondary | ICD-10-CM | POA: Diagnosis not present

## 2024-07-29 DIAGNOSIS — C44529 Squamous cell carcinoma of skin of other part of trunk: Secondary | ICD-10-CM | POA: Diagnosis not present

## 2024-07-29 DIAGNOSIS — E8581 Light chain (AL) amyloidosis: Secondary | ICD-10-CM | POA: Insufficient documentation

## 2024-07-29 DIAGNOSIS — D472 Monoclonal gammopathy: Secondary | ICD-10-CM

## 2024-07-29 DIAGNOSIS — C9000 Multiple myeloma not having achieved remission: Secondary | ICD-10-CM | POA: Insufficient documentation

## 2024-07-29 LAB — CBC WITH DIFFERENTIAL (CANCER CENTER ONLY)
Abs Immature Granulocytes: 0.01 K/uL (ref 0.00–0.07)
Basophils Absolute: 0.1 K/uL (ref 0.0–0.1)
Basophils Relative: 1 %
Eosinophils Absolute: 0.1 K/uL (ref 0.0–0.5)
Eosinophils Relative: 3 %
HCT: 46.2 % (ref 39.0–52.0)
Hemoglobin: 16.3 g/dL (ref 13.0–17.0)
Immature Granulocytes: 0 %
Lymphocytes Relative: 17 %
Lymphs Abs: 0.8 K/uL (ref 0.7–4.0)
MCH: 30.8 pg (ref 26.0–34.0)
MCHC: 35.3 g/dL (ref 30.0–36.0)
MCV: 87.3 fL (ref 80.0–100.0)
Monocytes Absolute: 0.4 K/uL (ref 0.1–1.0)
Monocytes Relative: 9 %
Neutro Abs: 3.5 K/uL (ref 1.7–7.7)
Neutrophils Relative %: 70 %
Platelet Count: 222 K/uL (ref 150–400)
RBC: 5.29 MIL/uL (ref 4.22–5.81)
RDW: 12.1 % (ref 11.5–15.5)
WBC Count: 5 K/uL (ref 4.0–10.5)
nRBC: 0 % (ref 0.0–0.2)

## 2024-07-29 LAB — CMP (CANCER CENTER ONLY)
ALT: 20 U/L (ref 0–44)
AST: 19 U/L (ref 15–41)
Albumin: 4.9 g/dL (ref 3.5–5.0)
Alkaline Phosphatase: 56 U/L (ref 38–126)
Anion gap: 5 (ref 5–15)
BUN: 17 mg/dL (ref 8–23)
CO2: 30 mmol/L (ref 22–32)
Calcium: 10.3 mg/dL (ref 8.9–10.3)
Chloride: 106 mmol/L (ref 98–111)
Creatinine: 0.91 mg/dL (ref 0.61–1.24)
GFR, Estimated: >60 mL/min (ref >60)
Glucose, Bld: 102 mg/dL — ABNORMAL HIGH (ref 70–99)
Potassium: 4.8 mmol/L (ref 3.5–5.1)
Sodium: 141 mmol/L (ref 135–145)
Total Bilirubin: 0.9 mg/dL (ref 0.0–1.2)
Total Protein: 7.3 g/dL (ref 6.5–8.1)

## 2024-07-30 ENCOUNTER — Inpatient Hospital Stay: Payer: Medicare Other

## 2024-07-30 ENCOUNTER — Inpatient Hospital Stay

## 2024-07-30 LAB — KAPPA/LAMBDA LIGHT CHAINS
Kappa free light chain: 5.7 mg/L (ref 3.3–19.4)
Kappa, lambda light chain ratio: 0.58 (ref 0.26–1.65)
Lambda free light chains: 9.9 mg/L (ref 5.7–26.3)

## 2024-08-01 LAB — MULTIPLE MYELOMA PANEL, SERUM
Albumin SerPl Elph-Mcnc: 4.2 g/dL (ref 2.9–4.4)
Albumin/Glob SerPl: 1.6 (ref 0.7–1.7)
Alpha 1: 0.2 g/dL (ref 0.0–0.4)
Alpha2 Glob SerPl Elph-Mcnc: 0.6 g/dL (ref 0.4–1.0)
B-Globulin SerPl Elph-Mcnc: 1.1 g/dL (ref 0.7–1.3)
Gamma Glob SerPl Elph-Mcnc: 0.8 g/dL (ref 0.4–1.8)
Globulin, Total: 2.8 g/dL (ref 2.2–3.9)
IgA: 82 mg/dL (ref 61–437)
IgG (Immunoglobin G), Serum: 780 mg/dL (ref 603–1613)
IgM (Immunoglobulin M), Srm: 63 mg/dL (ref 20–172)
M Protein SerPl Elph-Mcnc: 0.3 g/dL — ABNORMAL HIGH
Total Protein ELP: 7 g/dL (ref 6.0–8.5)

## 2024-08-06 ENCOUNTER — Inpatient Hospital Stay: Payer: Medicare Other | Admitting: Hematology

## 2024-08-06 DIAGNOSIS — E8581 Light chain (AL) amyloidosis: Secondary | ICD-10-CM | POA: Diagnosis not present

## 2024-08-06 DIAGNOSIS — C9000 Multiple myeloma not having achieved remission: Secondary | ICD-10-CM | POA: Diagnosis not present

## 2024-08-06 DIAGNOSIS — D472 Monoclonal gammopathy: Secondary | ICD-10-CM
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
## 2024-08-11 ENCOUNTER — Encounter: Payer: Self-pay | Admitting: Radiology

## 2024-08-18 DIAGNOSIS — K080 Exfoliation of teeth due to systemic causes: Secondary | ICD-10-CM | POA: Diagnosis not present

## 2024-11-05 ENCOUNTER — Encounter: Payer: Self-pay | Admitting: Family Medicine

## 2024-11-05 ENCOUNTER — Ambulatory Visit: Admitting: Family Medicine

## 2024-11-05 VITALS — BP 132/78 | HR 73 | Temp 98.0°F | Ht >= 80 in | Wt 237.0 lb

## 2024-11-05 DIAGNOSIS — Z7689 Persons encountering health services in other specified circumstances: Secondary | ICD-10-CM

## 2024-11-05 DIAGNOSIS — E782 Mixed hyperlipidemia: Secondary | ICD-10-CM

## 2024-11-05 DIAGNOSIS — N529 Male erectile dysfunction, unspecified: Secondary | ICD-10-CM | POA: Insufficient documentation

## 2024-11-05 DIAGNOSIS — Z125 Encounter for screening for malignant neoplasm of prostate: Secondary | ICD-10-CM | POA: Diagnosis not present

## 2024-11-05 MED ORDER — SILDENAFIL CITRATE 100 MG PO TABS: 100.0000 mg | ORAL_TABLET | Freq: Every day | ORAL | 3 refills | Status: DC | PRN

## 2024-11-05 MED ORDER — SILDENAFIL CITRATE 100 MG PO TABS: 100.0000 mg | ORAL_TABLET | Freq: Every day | ORAL | 3 refills | Status: AC | PRN

## 2024-11-05 NOTE — Assessment & Plan Note (Signed)
 Helpful with medication. He prefers to try the brand name of Viagra  instead of generic Sildenafil . He reports the generic causes GERD symptoms. Will try brand name medication. Refilled medication.
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
## 2024-11-05 NOTE — Assessment & Plan Note (Signed)
 Stable based on previous labs. Continue Atorvastatin  20mg  daily. Ordered lipid panel for a visit when able to fast. CMP was stable back in October with oncology.

## 2024-11-05 NOTE — Patient Instructions (Addendum)
-  It was nice to meet and look forward to take of care of you.  -Continue all medications.  -Refilled Viagra  as a brand name medication. -Ordered labs. Please schedule a lab appointment when able to fast. Office will call with lab results and will be available via MyChart.  -Follow up in 1 year for a physical and Annual Wellness Visit.

## 2024-11-06 ENCOUNTER — Other Ambulatory Visit

## 2024-11-06 ENCOUNTER — Ambulatory Visit: Payer: Self-pay | Admitting: Family Medicine

## 2024-11-06 DIAGNOSIS — Z125 Encounter for screening for malignant neoplasm of prostate: Secondary | ICD-10-CM | POA: Diagnosis not present

## 2024-11-06 DIAGNOSIS — E782 Mixed hyperlipidemia: Secondary | ICD-10-CM

## 2024-11-06 LAB — LIPID PANEL
Cholesterol: 258 mg/dL — ABNORMAL HIGH (ref 28–200)
HDL: 59.6 mg/dL
LDL Cholesterol: 178 mg/dL — ABNORMAL HIGH (ref 10–99)
NonHDL: 197.94
Total CHOL/HDL Ratio: 4
Triglycerides: 99 mg/dL (ref 10.0–149.0)
VLDL: 19.8 mg/dL (ref 0.0–40.0)

## 2024-11-06 LAB — PSA: PSA: 3.27 ng/mL (ref 0.10–4.00)

## 2025-01-12 ENCOUNTER — Ambulatory Visit

## 2025-07-22 ENCOUNTER — Inpatient Hospital Stay

## 2025-08-07 ENCOUNTER — Inpatient Hospital Stay: Admitting: Hematology

## 2025-11-06 ENCOUNTER — Encounter: Admitting: Family Medicine
